# Patient Record
Sex: Male | Born: 1975 | Race: White | Hispanic: No | Marital: Married | Smoking: Never smoker
Health system: Southern US, Community
[De-identification: ages and names within clinical notes are randomized; demographics above are authoritative.]

## PROBLEM LIST (undated history)

## (undated) DIAGNOSIS — F32A Depression, unspecified: Secondary | ICD-10-CM

## (undated) DIAGNOSIS — F329 Major depressive disorder, single episode, unspecified: Secondary | ICD-10-CM

## (undated) DIAGNOSIS — I10 Essential (primary) hypertension: Secondary | ICD-10-CM

## (undated) HISTORY — PX: TONSILECTOMY, ADENOIDECTOMY, BILATERAL MYRINGOTOMY AND TUBES: SHX2538

## (undated) HISTORY — DX: Essential (primary) hypertension: I10

## (undated) HISTORY — DX: Major depressive disorder, single episode, unspecified: F32.9

## (undated) HISTORY — PX: MOLE REMOVAL: SHX2046

## (undated) HISTORY — DX: Depression, unspecified: F32.A

---

## 2015-07-25 DIAGNOSIS — H52223 Regular astigmatism, bilateral: Secondary | ICD-10-CM | POA: Diagnosis not present

## 2016-02-05 ENCOUNTER — Telehealth: Payer: Self-pay | Admitting: General Surgery

## 2016-02-05 ENCOUNTER — Ambulatory Visit (INDEPENDENT_AMBULATORY_CARE_PROVIDER_SITE_OTHER): Payer: 59 | Admitting: Family

## 2016-02-05 ENCOUNTER — Encounter: Payer: Self-pay | Admitting: Family

## 2016-02-05 VITALS — BP 142/96 | HR 95 | Temp 98.2°F | Ht 76.0 in | Wt 343.8 lb

## 2016-02-05 DIAGNOSIS — Z7689 Persons encountering health services in other specified circumstances: Secondary | ICD-10-CM

## 2016-02-05 DIAGNOSIS — R2242 Localized swelling, mass and lump, left lower limb: Secondary | ICD-10-CM

## 2016-02-05 LAB — COMPREHENSIVE METABOLIC PANEL
ALT: 33 U/L (ref 0–53)
AST: 22 U/L (ref 0–37)
Albumin: 4.5 g/dL (ref 3.5–5.2)
Alkaline Phosphatase: 72 U/L (ref 39–117)
BUN: 12 mg/dL (ref 6–23)
CHLORIDE: 101 meq/L (ref 96–112)
CO2: 31 mEq/L (ref 19–32)
Calcium: 9.4 mg/dL (ref 8.4–10.5)
Creatinine, Ser: 0.94 mg/dL (ref 0.40–1.50)
GFR: 95.26 mL/min (ref >60.00)
GLUCOSE: 110 mg/dL — AB (ref 70–99)
POTASSIUM: 4.4 meq/L (ref 3.5–5.1)
Sodium: 141 mEq/L (ref 135–145)
TOTAL PROTEIN: 7 g/dL (ref 6.0–8.3)
Total Bilirubin: 0.7 mg/dL (ref 0.2–1.2)

## 2016-02-05 LAB — LIPID PANEL
Cholesterol: 159 mg/dL (ref 0–200)
HDL: 50.4 mg/dL (ref >39.00)
LDL Cholesterol: 93 mg/dL (ref 0–99)
NONHDL: 108.58
Total CHOL/HDL Ratio: 3
Triglycerides: 77 mg/dL (ref 0.0–149.0)
VLDL: 15.4 mg/dL (ref 0.0–40.0)

## 2016-02-05 LAB — CBC WITH DIFFERENTIAL/PLATELET
Basophils Absolute: 0 10*3/uL (ref 0.0–0.1)
Basophils Relative: 0.4 % (ref 0.0–3.0)
EOS ABS: 0.1 10*3/uL (ref 0.0–0.7)
EOS PCT: 1.1 % (ref 0.0–5.0)
HCT: 46.2 % (ref 39.0–52.0)
Hemoglobin: 15.9 g/dL (ref 13.0–17.0)
LYMPHS ABS: 1.8 10*3/uL (ref 0.7–4.0)
Lymphocytes Relative: 28.6 % (ref 12.0–46.0)
MCHC: 34.4 g/dL (ref 30.0–36.0)
MCV: 88.3 fl (ref 78.0–100.0)
MONO ABS: 0.5 10*3/uL (ref 0.1–1.0)
Monocytes Relative: 8.7 % (ref 3.0–12.0)
NEUTROS PCT: 61.2 % (ref 43.0–77.0)
Neutro Abs: 3.8 10*3/uL (ref 1.4–7.7)
Platelets: 215 10*3/uL (ref 150.0–400.0)
RBC: 5.23 Mil/uL (ref 4.22–5.81)
RDW: 13.1 % (ref 11.5–15.5)
WBC: 6.3 10*3/uL (ref 4.0–10.5)

## 2016-02-05 LAB — HEMOGLOBIN A1C: Hgb A1c MFr Bld: 5.8 % (ref 4.6–6.5)

## 2016-02-05 LAB — B12 AND FOLATE PANEL
Folate: 16.2 ng/mL (ref >5.9)
Vitamin B-12: 245 pg/mL (ref 211–911)

## 2016-02-05 LAB — VITAMIN D 25 HYDROXY (VIT D DEFICIENCY, FRACTURES): VITD: 16.82 ng/mL — ABNORMAL LOW (ref 30.00–100.00)

## 2016-02-05 LAB — TSH: TSH: 2.14 u[IU]/mL (ref 0.35–4.50)

## 2016-02-05 NOTE — Patient Instructions (Signed)
Referral to general surgery  Labs today  Ultrasound of  Left thigh  If there is no improvement in your symptoms, or if there is any worsening of symptoms, or if you have any additional concerns, please return for re-evaluation; or, if we are closed, consider going to the Emergency Room for evaluation if symptoms urgent.  Pleasure meeting you

## 2016-02-05 NOTE — Telephone Encounter (Signed)
02-05-16 L/M FOR PT TO RETURN CALL & SCHEDULE AN APPT WITH DR Marjorie Smolder CALL WHEN APPT WAS REQUESTED)LT THIGH LIPOMA & HAS GROW IN SIZE .? HAD OR IS SCHEDULED FOR AN U/S OF LT THIGH(PER OFC NOTE)/MTH

## 2016-02-05 NOTE — Progress Notes (Signed)
Subjective:    Patient ID: Gregory Hicks, male    DOB: [birth year], 40 y.o.   MRN: [id number]  CC: Gregory Hicks is a 40 y.o. male who presents today to establish care.    HPI: Here to establish care. Prior care had been 2015 with [facility]. Requesting records.    H/o of left thigh lipoma. Non tender. Grown in size. No warmth, drainage.      HISTORY:  Past Medical History:  Diagnosis Date  . Depression   . Hypertension    Past Surgical History:  Procedure Laterality Date  . MOLE REMOVAL    . TONSILECTOMY, ADENOIDECTOMY, BILATERAL MYRINGOTOMY AND TUBES     Family History  Problem Relation Age of Onset  . Arthritis Mother   . Hypertension Mother   . Arthritis Father   . Hypertension Father   . Depression Maternal Aunt   . Depression Maternal Uncle   . Depression Paternal Aunt   . Depression Paternal Uncle   . Cancer Maternal Grandmother   . Mental illness Maternal Grandmother   . Depression Maternal Grandmother   . Heart disease Maternal Grandfather   . Depression Maternal Grandfather   . Cancer Paternal Grandmother   . Depression Paternal Grandmother   . Depression Paternal Grandfather     Allergies: Patient has no allergy information on record. No current outpatient prescriptions on file prior to visit.   No current facility-administered medications on file prior to visit.     Social History  Substance Use Topics  . Smoking status: Never Smoker  . Smokeless tobacco: Never Used  . Alcohol use Yes    Review of Systems  Constitutional: Negative for chills and fever.  Respiratory: Negative for cough.   Cardiovascular: Negative for chest pain and palpitations.  Gastrointestinal: Negative for nausea and vomiting.  Skin: Negative for color change and rash.      Objective:    BP (!) 142/96   Pulse 95   Temp 98.2 F (36.8 C) (Oral)   Ht 6\' 4"  (1.93 m)   Wt (!) 343 lb 12.8 oz (155.9 kg)   SpO2 96%   BMI 41.85 kg/m  BP Readings from Last 3  Encounters:  02/05/16 (!) 142/96   Wt Readings from Last 3 Encounters:  02/05/16 (!) 343 lb 12.8 oz (155.9 kg)    Physical Exam  Constitutional: He appears well-developed and well-nourished.  Cardiovascular: Regular rhythm and normal heart sounds.   Pulmonary/Chest: Effort normal and breath sounds normal. No respiratory distress. He has no wheezes. He has no rhonchi. He has no rales.  Neurological: He is alert.  Skin: Skin is warm and dry.     Psychiatric: He has a normal mood and affect. His speech is normal and behavior is normal.  Vitals reviewed.      Assessment & Plan:   Problem List Items Addressed This Visit      Other   Encounter to establish care - Primary    Reviewed past medical and social history. Screening labs today. Patient will return for physical.      Relevant Orders   CBC with Differential/Platelet (Completed)   Comprehensive metabolic panel (Completed)   Hemoglobin A1c (Completed)   HIV antibody (Completed)   Lipid panel (Completed)   VITAMIN D 25 Hydroxy (Vit-D Deficiency, Fractures) (Completed)   TSH (Completed)   B12 and Folate Panel (Completed)   Leg mass, left    Nonspecific at this time. Working diagnosis cyst versus lipoma. Pending ultrasound and  consult to general surgery as patient would like to consider removal.       Relevant Orders   Ambulatory referral to General Surgery   [place] Misc Soft Tissue       Mr. Toman does not currently have medications on file.   No orders of the defined types were placed in this encounter.   Return precautions given.   Risks, benefits, and alternatives of the medications and treatment plan prescribed today were discussed, and patient expressed understanding.   Education regarding symptom management and diagnosis given to patient on AVS.  Continue to follow with No primary care provider on file. for routine health maintenance.   [place] and I agreed with plan.   Mable Paris,  FNP

## 2016-02-05 NOTE — Progress Notes (Signed)
Pre visit review using our clinic review tool, if applicable. No additional management support is needed unless otherwise documented below in the visit note. 

## 2016-02-06 ENCOUNTER — Encounter: Payer: Self-pay | Admitting: Family

## 2016-02-06 ENCOUNTER — Other Ambulatory Visit: Payer: Self-pay | Admitting: Family

## 2016-02-06 DIAGNOSIS — R2242 Localized swelling, mass and lump, left lower limb: Secondary | ICD-10-CM | POA: Insufficient documentation

## 2016-02-06 DIAGNOSIS — Z0000 Encounter for general adult medical examination without abnormal findings: Secondary | ICD-10-CM | POA: Insufficient documentation

## 2016-02-06 DIAGNOSIS — Z1211 Encounter for screening for malignant neoplasm of colon: Secondary | ICD-10-CM | POA: Insufficient documentation

## 2016-02-06 LAB — HIV ANTIBODY (ROUTINE TESTING W REFLEX): HIV: NONREACTIVE

## 2016-02-06 NOTE — Assessment & Plan Note (Signed)
Reviewed past medical and social history. Screening labs today. Patient will return for physical.

## 2016-02-06 NOTE — Assessment & Plan Note (Signed)
Nonspecific at this time. Working diagnosis cyst versus lipoma. Pending ultrasound and consult to general surgery as patient would like to consider removal.

## 2016-02-11 ENCOUNTER — Other Ambulatory Visit: Payer: Self-pay | Admitting: Family

## 2016-02-12 ENCOUNTER — Other Ambulatory Visit: Payer: Self-pay | Admitting: Family

## 2016-02-12 DIAGNOSIS — R2242 Localized swelling, mass and lump, left lower limb: Secondary | ICD-10-CM

## 2016-02-17 ENCOUNTER — Telehealth: Payer: Self-pay | Admitting: Family

## 2016-02-17 NOTE — Telephone Encounter (Signed)
L/M FOR PT TO CALL OF & SCHEDULE AN APPT WITH DR [facility]

## 2016-02-17 NOTE — Telephone Encounter (Signed)
Pt called and stated that he never authorized to have an HIV screening done and now he is being billed for $104. Please advise, thank you!  Call pt @ [phone]

## 2016-02-18 ENCOUNTER — Ambulatory Visit
Admission: RE | Admit: 2016-02-18 | Discharge: 2016-02-18 | Disposition: A | Discharge status: Home / Self Care | Payer: 59 | Source: Ambulatory Visit | Attending: Family | Admitting: Family

## 2016-02-18 DIAGNOSIS — R2242 Localized swelling, mass and lump, left lower limb: Secondary | ICD-10-CM | POA: Insufficient documentation

## 2016-02-19 ENCOUNTER — Encounter: Payer: Self-pay | Admitting: *Deleted

## 2016-02-26 ENCOUNTER — Ambulatory Visit: Payer: Self-pay | Admitting: General Surgery

## 2016-03-02 ENCOUNTER — Encounter: Payer: Self-pay | Admitting: Family

## 2016-03-05 ENCOUNTER — Encounter: Payer: Self-pay | Admitting: General Surgery

## 2016-03-05 ENCOUNTER — Ambulatory Visit (INDEPENDENT_AMBULATORY_CARE_PROVIDER_SITE_OTHER): Payer: 59 | Admitting: General Surgery

## 2016-03-05 ENCOUNTER — Telehealth: Payer: Self-pay | Admitting: Family

## 2016-03-05 VITALS — BP 160/90 | HR 78 | Resp 14 | Ht 76.0 in | Wt 341.0 lb

## 2016-03-05 DIAGNOSIS — D1724 Benign lipomatous neoplasm of skin and subcutaneous tissue of left leg: Secondary | ICD-10-CM

## 2016-03-05 NOTE — Progress Notes (Signed)
Patient ID: Gregory Hicks, male   DOB: [birth year], 41 y.o.   MRN: [id number]  Chief Complaint  Patient presents with  . Lipoma    HPI Gregory Hicks is a 41 y.o. male here today for a evaluation of a lipoma. Patient state he noticed this area on his left thigh about 5 years ago. He states there is some pain with walking. Additionally notes a change in size. Ultrasound done on 02/18/2016 I have reviewed the history of present illness with the patient.   HPI  Past Medical History:  Diagnosis Date  . Depression   . Hypertension     Past Surgical History:  Procedure Laterality Date  . MOLE REMOVAL    . TONSILECTOMY, ADENOIDECTOMY, BILATERAL MYRINGOTOMY AND TUBES      Family History  Problem Relation Age of Onset  . Arthritis Mother   . Hypertension Mother   . Arthritis Father   . Hypertension Father   . Depression Maternal Aunt   . Depression Maternal Uncle   . Depression Paternal Aunt   . Depression Paternal Uncle   . Cancer Maternal Grandmother   . Mental illness Maternal Grandmother   . Depression Maternal Grandmother   . Heart disease Maternal Grandfather   . Depression Maternal Grandfather   . Cancer Paternal Grandmother   . Depression Paternal Grandmother   . Depression Paternal Grandfather     Social History Social History  Substance Use Topics  . Smoking status: Never Smoker  . Smokeless tobacco: Never Used  . Alcohol use Yes    No Known Allergies  No current outpatient prescriptions on file.   No current facility-administered medications for this visit.     Review of Systems Review of Systems  Constitutional: Negative.   Respiratory: Negative.   Cardiovascular: Negative.     Blood pressure (!) 160/90, pulse 78, resp. rate 14, height 6\' 4"  (1.93 m), weight (!) 341 lb (154.7 kg).  Physical Exam Physical Exam  Constitutional: He is oriented to person, place, and time. He appears well-developed and well-nourished.  Eyes: Conjunctivae are normal.  No scleral icterus.  Neck: Neck supple.  Cardiovascular: Normal rate, regular rhythm and normal heart sounds.   Pulmonary/Chest: Effort normal and breath sounds normal.  Abdominal: Soft. Bowel sounds are normal. There is no tenderness.  Lymphadenopathy:    He has no cervical adenopathy.  Neurological: He is alert and oriented to person, place, and time.  Skin: Skin is dry.       Data Reviewed Prior notes reviewed.  Assessment    Skin lipoma with an underlying 5-7 cm subcutaneous lipoma    Plan    Mass is too large for removal in the office.  Plan for outpatient excision of lipoma at [facility].  The patient is aware to call back for any questions or concerns.  Patient wishes to contact the office when he would like to arrange.      This information has been scribed by Gaspar Cola CMA.   SANKAR,SEEPLAPUTHUR G 03/09/2016, 8:54 AM

## 2016-03-05 NOTE — Telephone Encounter (Signed)
Left vm-  Wanted to speak with him and apologize about HIV test. I take full responsibility.  Please advise that our office staff is working on rectifying.   Also, happy to hear he has an appt with a Education officer, environmental.

## 2016-03-10 ENCOUNTER — Encounter: Payer: Self-pay | Admitting: General Surgery

## 2016-03-23 DIAGNOSIS — Z3141 Encounter for fertility testing: Secondary | ICD-10-CM | POA: Diagnosis not present

## 2016-07-30 DIAGNOSIS — H52223 Regular astigmatism, bilateral: Secondary | ICD-10-CM | POA: Diagnosis not present

## 2016-09-28 NOTE — Telephone Encounter (Signed)
Bill paid.

## 2017-06-30 ENCOUNTER — Ambulatory Visit (INDEPENDENT_AMBULATORY_CARE_PROVIDER_SITE_OTHER): Payer: 59 | Admitting: Family

## 2017-06-30 ENCOUNTER — Encounter: Payer: Self-pay | Admitting: Family

## 2017-06-30 VITALS — BP 138/90 | HR 89 | Temp 98.7°F | Resp 15 | Ht 75.5 in | Wt 339.5 lb

## 2017-06-30 DIAGNOSIS — R030 Elevated blood-pressure reading, without diagnosis of hypertension: Secondary | ICD-10-CM | POA: Diagnosis not present

## 2017-06-30 DIAGNOSIS — R2242 Localized swelling, mass and lump, left lower limb: Secondary | ICD-10-CM

## 2017-06-30 DIAGNOSIS — Z0001 Encounter for general adult medical examination with abnormal findings: Secondary | ICD-10-CM

## 2017-06-30 DIAGNOSIS — Z0000 Encounter for general adult medical examination without abnormal findings: Secondary | ICD-10-CM

## 2017-06-30 LAB — LIPID PANEL
Cholesterol: 157 mg/dL (ref 0–200)
HDL: 42.9 mg/dL (ref >39.00)
LDL Cholesterol: 92 mg/dL (ref 0–99)
NonHDL: 113.97
Total CHOL/HDL Ratio: 4
Triglycerides: 110 mg/dL (ref 0.0–149.0)
VLDL: 22 mg/dL (ref 0.0–40.0)

## 2017-06-30 LAB — COMPREHENSIVE METABOLIC PANEL
ALBUMIN: 4.4 g/dL (ref 3.5–5.2)
ALK PHOS: 62 U/L (ref 39–117)
ALT: 48 U/L (ref 0–53)
AST: 31 U/L (ref 0–37)
BUN: 12 mg/dL (ref 6–23)
CO2: 30 mEq/L (ref 19–32)
Calcium: 9.5 mg/dL (ref 8.4–10.5)
Chloride: 102 mEq/L (ref 96–112)
Creatinine, Ser: 1.04 mg/dL (ref 0.40–1.50)
GFR: 84.16 mL/min (ref >60.00)
Glucose, Bld: 94 mg/dL (ref 70–99)
POTASSIUM: 4 meq/L (ref 3.5–5.1)
SODIUM: 139 meq/L (ref 135–145)
TOTAL PROTEIN: 7.5 g/dL (ref 6.0–8.3)
Total Bilirubin: 1 mg/dL (ref 0.2–1.2)

## 2017-06-30 LAB — CBC WITH DIFFERENTIAL/PLATELET
Basophils Absolute: 0 10*3/uL (ref 0.0–0.1)
Basophils Relative: 0.9 % (ref 0.0–3.0)
EOS ABS: 0.1 10*3/uL (ref 0.0–0.7)
EOS PCT: 1.5 % (ref 0.0–5.0)
HCT: 48 % (ref 39.0–52.0)
HEMOGLOBIN: 15.8 g/dL (ref 13.0–17.0)
Lymphocytes Relative: 36.6 % (ref 12.0–46.0)
Lymphs Abs: 1.7 10*3/uL (ref 0.7–4.0)
MCHC: 32.9 g/dL (ref 30.0–36.0)
MCV: 91.9 fl (ref 78.0–100.0)
MONO ABS: 0.5 10*3/uL (ref 0.1–1.0)
Monocytes Relative: 11 % (ref 3.0–12.0)
Neutro Abs: 2.4 10*3/uL (ref 1.4–7.7)
Neutrophils Relative %: 50 % (ref 43.0–77.0)
Platelets: 201 10*3/uL (ref 150.0–400.0)
RBC: 5.22 Mil/uL (ref 4.22–5.81)
RDW: 13.7 % (ref 11.5–15.5)
WBC: 4.7 10*3/uL (ref 4.0–10.5)

## 2017-06-30 LAB — VITAMIN D 25 HYDROXY (VIT D DEFICIENCY, FRACTURES): VITD: 20.79 ng/mL — AB (ref 30.00–100.00)

## 2017-06-30 LAB — HEMOGLOBIN A1C: Hgb A1c MFr Bld: 5.7 % (ref 4.6–6.5)

## 2017-06-30 NOTE — Assessment & Plan Note (Signed)
Declines second surgery consult.  Patient will monitor for now.

## 2017-06-30 NOTE — Assessment & Plan Note (Signed)
Testicular exam performed today.  Screening labs ordered.  Referral to dermatology.  Encourage exercise

## 2017-06-30 NOTE — Assessment & Plan Note (Signed)
Slightly elevated today.  Patient will monitor at home let me know if persistent.

## 2017-06-30 NOTE — Progress Notes (Signed)
Subjective:    Patient ID: Gregory Hicks, male    DOB: [birth year], 42 y.o.   MRN: [id number]  CC: Gregory Hicks is a 42 y.o. male who presents today for physical exam.    HPI: Feeling well. No complaints.   H/o htn- higher at doctors office.   127/84. Denies exertional chest pain or pressure, numbness or tingling radiating to left arm or jaw, palpitations, dizziness, frequent headaches, changes in vision, or shortness of breath.    Left lipoma- not planning surgery.   Colorectal  Cancer Screening: no early family history Prostate Cancer Screening: Has been discussed with patient;  Will start screening in 40's.  Lung Cancer Screening: No 30 year pack year history and > 55 years.  Immunizations       Tetanus - utd       Labs: Screening labs today. Exercise: Gets regular exercise.  Alcohol use:  rare Smoking/tobacco use: Nonsmoker.  Regular dental exams: UTD Wears seat belt: Yes. Skin: h/o mole removed, precancerous.   HISTORY:  Past Medical History:  Diagnosis Date  . Depression   . Hypertension     Past Surgical History:  Procedure Laterality Date  . MOLE REMOVAL    . TONSILECTOMY, ADENOIDECTOMY, BILATERAL MYRINGOTOMY AND TUBES     Family History  Problem Relation Age of Onset  . Arthritis Mother   . Hypertension Mother   . Arthritis Father   . Hypertension Father   . Depression Maternal Aunt   . Depression Maternal Uncle   . Depression Paternal Aunt   . Depression Paternal Uncle   . Cancer Maternal Grandmother        breast  . Mental illness Maternal Grandmother   . Depression Maternal Grandmother   . Heart disease Maternal Grandfather   . Depression Maternal Grandfather   . Cancer Paternal Grandmother        breast  . Depression Paternal Grandmother   . Depression Paternal Grandfather   . Colon cancer Neg Hx   . Prostate cancer Neg Hx       ALLERGIES: Penicillins  Current Outpatient Medications on File Prior to Visit  Medication Sig Dispense  Refill  . cetirizine (ZYRTEC ALLERGY) 10 MG tablet      No current facility-administered medications on file prior to visit.     Social History   Tobacco Use  . Smoking status: Never Smoker  . Smokeless tobacco: Never Used  Substance Use Topics  . Alcohol use: Yes    Comment: beer once a month  . Drug use: No    Review of Systems  Constitutional: Negative for chills and fever.  HENT: Negative for congestion.   Respiratory: Negative for cough.   Cardiovascular: Negative for chest pain, palpitations and leg swelling.  Gastrointestinal: Negative for diarrhea, nausea and vomiting.  Genitourinary: Negative for difficulty urinating, penile swelling, scrotal swelling and testicular pain.  Musculoskeletal: Negative for myalgias.  Skin: Negative for rash.  Neurological: Negative for headaches.  Hematological: Negative for adenopathy.  Psychiatric/Behavioral: Negative for confusion.      Objective:    BP 138/90 (BP Location: Left Arm, Patient Position: Sitting, Cuff Size: Large)   Pulse 89   Temp 98.7 F (37.1 C) (Oral)   Resp 15   Ht 6' 3.5" (1.918 m)   Wt (!) 339 lb 8 oz (154 kg)   SpO2 98%   BMI 41.87 kg/m   BP Readings from Last 3 Encounters:  06/30/17 138/90  03/05/16 (!) 160/90  02/05/16 Marland Kitchen)  142/96   Wt Readings from Last 3 Encounters:  06/30/17 (!) 339 lb 8 oz (154 kg)  03/05/16 (!) 341 lb (154.7 kg)  02/05/16 (!) 343 lb 12.8 oz (155.9 kg)    Physical Exam  Constitutional: He appears well-developed and well-nourished.  Neck: No thyroid mass and no thyromegaly present.  Cardiovascular: Regular rhythm and normal heart sounds.  Pulmonary/Chest: Effort normal and breath sounds normal. No respiratory distress. He has no wheezes. He has no rhonchi. He has no rales.  Genitourinary: Testes normal and penis normal. Cremasteric reflex is present. Right testis shows no mass, no swelling and no tenderness. Left testis shows no mass, no swelling and no tenderness.  Circumcised.  Genitourinary Comments: Testicular exam performed. No masses or asymmetry appreciated.   Lymphadenopathy:       Head (right side): No submental, no submandibular, no tonsillar, no preauricular, no posterior auricular and no occipital adenopathy present.       Head (left side): No submental, no submandibular, no tonsillar, no preauricular, no posterior auricular and no occipital adenopathy present.    He has no cervical adenopathy.    He has no axillary adenopathy.  Neurological: He is alert.  Skin: Skin is warm and dry.  Psychiatric: He has a normal mood and affect. His speech is normal and behavior is normal.  Vitals reviewed.      Assessment & Plan:   Problem List Items Addressed This Visit      Other   Routine physical examination - Primary    Testicular exam performed today.  Screening labs ordered.  Referral to dermatology.  Encourage exercise      Relevant Orders   Ambulatory referral to Dermatology   Hemoglobin A1c   VITAMIN D 25 Hydroxy (Vit-D Deficiency, Fractures)   Lipid panel   Comprehensive metabolic panel   CBC with Differential/Platelet   Leg mass, left    Declines second surgery consult.  Patient will monitor for now.      Elevated blood pressure reading    Slightly elevated today.  Patient will monitor at home let me know if persistent.          I am having Gregory Hicks maintain his cetirizine.   No orders of the defined types were placed in this encounter.   Return precautions given.   Risks, benefits, and alternatives of the medications and treatment plan prescribed today were discussed, and patient expressed understanding.   Education regarding symptom management and diagnosis given to patient on AVS.   Continue to follow with Gregory Hawthorne, FNP for routine health maintenance.   Gregory Hicks and I agreed with plan.   Gregory Paris, FNP

## 2017-06-30 NOTE — Patient Instructions (Addendum)
Monitor blood pressure,  Goal is less than 130/80; if persistently higher, please make sooner follow up appointment so we can recheck you blood pressure and manage medications.     Labs today   Health Maintenance, Male A healthy lifestyle and preventive care is important for your health and wellness. Ask your health care provider about what schedule of regular examinations is right for you. What should I know about weight and diet? Eat a Healthy Diet  Eat plenty of vegetables, fruits, whole grains, low-fat dairy products, and lean protein.  Do not eat a lot of foods high in solid fats, added sugars, or salt.  Maintain a Healthy Weight Regular exercise can help you achieve or maintain a healthy weight. You should:  Do at least 150 minutes of exercise each week. The exercise should increase your heart rate and make you sweat (moderate-intensity exercise).  Do strength-training exercises at least twice a week.  Watch Your Levels of Cholesterol and Blood Lipids  Have your blood tested for lipids and cholesterol every 5 years starting at 40 years of age. If you are at high risk for heart disease, you should start having your blood tested when you are 40 years old. You may need to have your cholesterol levels checked more often if: ? Your lipid or cholesterol levels are high. ? You are older than 40 years of age. ? You are at high risk for heart disease.  What should I know about cancer screening? Many types of cancers can be detected early and may often be prevented. Lung Cancer  You should be screened every year for lung cancer if: ? You are a current smoker who has smoked for at least 30 years. ? You are a former smoker who has quit within the past 15 years.  Talk to your health care provider about your screening options, when you should start screening, and how often you should be screened.  Colorectal Cancer  Routine colorectal cancer screening usually begins at 40 years of  age and should be repeated every 5-10 years until you are 40 years old. You may need to be screened more often if early forms of precancerous polyps or small growths are found. Your health care provider may recommend screening at an earlier age if you have risk factors for colon cancer.  Your health care provider may recommend using home test kits to check for hidden blood in the stool.  A small camera at the end of a tube can be used to examine your colon (sigmoidoscopy or colonoscopy). This checks for the earliest forms of colorectal cancer.  Prostate and Testicular Cancer  Depending on your age and overall health, your health care provider may do certain tests to screen for prostate and testicular cancer.  Talk to your health care provider about any symptoms or concerns you have about testicular or prostate cancer.  Skin Cancer  Check your skin from head to toe regularly.  Tell your health care provider about any new moles or changes in moles, especially if: ? There is a change in a mole's size, shape, or color. ? You have a mole that is larger than a pencil eraser.  Always use sunscreen. Apply sunscreen liberally and repeat throughout the day.  Protect yourself by wearing long sleeves, pants, a wide-brimmed hat, and sunglasses when outside.  What should I know about heart disease, diabetes, and high blood pressure?  If you are 53-45 years of age, have your blood pressure checked every 3-5  years. If you are 80 years of age or older, have your blood pressure checked every year. You should have your blood pressure measured twice-once when you are at a hospital or clinic, and once when you are not at a hospital or clinic. Record the average of the two measurements. To check your blood pressure when you are not at a hospital or clinic, you can use: ? An automated blood pressure machine at a pharmacy. ? A home blood pressure monitor.  Talk to your health care provider about your target  blood pressure.  If you are between 9-61 years old, ask your health care provider if you should take aspirin to prevent heart disease.  Have regular diabetes screenings by checking your fasting blood sugar level. ? If you are at a normal weight and have a low risk for diabetes, have this test once every three years after the age of 88. ? If you are overweight and have a high risk for diabetes, consider being tested at a younger age or more often.  A one-time screening for abdominal aortic aneurysm (AAA) by ultrasound is recommended for men aged 59-75 years who are current or former smokers. What should I know about preventing infection? Hepatitis B If you have a higher risk for hepatitis B, you should be screened for this virus. Talk with your health care provider to find out if you are at risk for hepatitis B infection. Hepatitis C Blood testing is recommended for:  Everyone born from [birth year] through 1963.  Anyone with known risk factors for hepatitis C.  Sexually Transmitted Diseases (STDs)  You should be screened each year for STDs including gonorrhea and chlamydia if: ? You are sexually active and are younger than 40 years of age. ? You are older than 40 years of age and your health care provider tells you that you are at risk for this type of infection. ? Your sexual activity has changed since you were last screened and you are at an increased risk for chlamydia or gonorrhea. Ask your health care provider if you are at risk.  Talk with your health care provider about whether you are at high risk of being infected with HIV. Your health care provider may recommend a prescription medicine to help prevent HIV infection.  What else can I do?  Schedule regular health, dental, and eye exams.  Stay current with your vaccines (immunizations).  Do not use any tobacco products, such as cigarettes, chewing tobacco, and e-cigarettes. If you need help quitting, ask your health care  provider.  Limit alcohol intake to no more than 2 drinks per day. One drink equals 12 ounces of beer, 5 ounces of wine, or 1 ounces of hard liquor.  Do not use street drugs.  Do not share needles.  Ask your health care provider for help if you need support or information about quitting drugs.  Tell your health care provider if you often feel depressed.  Tell your health care provider if you have ever been abused or do not feel safe at home. This information is not intended to replace advice given to you by your health care provider. Make sure you discuss any questions you have with your health care provider. Document Released: 07/25/2007 Document Revised: 09/25/2015 Document Reviewed: 10/30/2014 Elsevier Interactive Patient Education  [employer].

## 2018-06-10 DIAGNOSIS — R222 Localized swelling, mass and lump, trunk: Secondary | ICD-10-CM | POA: Insufficient documentation

## 2019-06-09 ENCOUNTER — Encounter: Payer: Self-pay | Admitting: Family

## 2019-06-16 ENCOUNTER — Encounter: Payer: Self-pay | Admitting: Family

## 2019-06-16 ENCOUNTER — Ambulatory Visit (INDEPENDENT_AMBULATORY_CARE_PROVIDER_SITE_OTHER): Payer: 59 | Admitting: Family

## 2019-06-16 ENCOUNTER — Other Ambulatory Visit: Payer: Self-pay

## 2019-06-16 VITALS — BP 144/80 | HR 106 | Temp 97.7°F | Ht 74.5 in | Wt 279.8 lb

## 2019-06-16 DIAGNOSIS — R2242 Localized swelling, mass and lump, left lower limb: Secondary | ICD-10-CM

## 2019-06-16 DIAGNOSIS — Z0000 Encounter for general adult medical examination without abnormal findings: Secondary | ICD-10-CM | POA: Diagnosis not present

## 2019-06-16 DIAGNOSIS — D171 Benign lipomatous neoplasm of skin and subcutaneous tissue of trunk: Secondary | ICD-10-CM

## 2019-06-16 DIAGNOSIS — R030 Elevated blood-pressure reading, without diagnosis of hypertension: Secondary | ICD-10-CM

## 2019-06-16 NOTE — Patient Instructions (Addendum)
Call [facility] since your wife goes there; let me know if you need a referral.   Referral to surgery,  Let us know if you dont hear back within a week in regards to an appointment being scheduled.   Continue to monitor blood pressure as we discussed today.  If Blood pressure does not reach normal 120/80 as you continue your weight loss journey, please let me know.   Managing Your Hypertension Hypertension is commonly called high blood pressure. This is when the force of your blood pressing against the walls of your arteries is too strong. Arteries are blood vessels that carry blood from your heart throughout your body. Hypertension forces the heart to work harder to pump blood, and may cause the arteries to become narrow or stiff. Having untreated or uncontrolled hypertension can cause heart attack, stroke, kidney disease, and other problems. What are blood pressure readings? A blood pressure reading consists of a higher number over a lower number. Ideally, your blood pressure should be below 120/80. The first ("top") number is called the systolic pressure. It is a measure of the pressure in your arteries as your heart beats. The second ("bottom") number is called the diastolic pressure. It is a measure of the pressure in your arteries as the heart relaxes. What does my blood pressure reading mean? Blood pressure is classified into four stages. Based on your blood pressure reading, your health care provider may use the following stages to determine what type of treatment you need, if any. Systolic pressure and diastolic pressure are measured in a unit called mm Hg. Normal  Systolic pressure: below 123456.  Diastolic pressure: below 80. Elevated  Systolic pressure: [id number].  Diastolic pressure: below 80. Hypertension stage 1  Systolic pressure: 0000000.  Diastolic pressure: [id number]. Hypertension stage 2  Systolic pressure: [id number] or above.  Diastolic pressure: 90 or above. What  health risks are associated with hypertension? Managing your hypertension is an important responsibility. Uncontrolled hypertension can lead to:  A heart attack.  A stroke.  A weakened blood vessel (aneurysm).  Heart failure.  Kidney damage.  Eye damage.  Metabolic syndrome.  Memory and concentration problems. What changes can I make to manage my hypertension? Hypertension can be managed by making lifestyle changes and possibly by taking medicines. Your health care provider will help you make a plan to bring your blood pressure within a normal range. Eating and drinking   Eat a diet that is high in fiber and potassium, and low in salt (sodium), added sugar, and fat. An example eating plan is called the DASH (Dietary Approaches to Stop Hypertension) diet. To eat this way: ? Eat plenty of fresh fruits and vegetables. Try to fill half of your plate at each meal with fruits and vegetables. ? Eat whole grains, such as whole wheat pasta, brown rice, or whole grain bread. Fill about one quarter of your plate with whole grains. ? Eat low-fat diary products. ? Avoid fatty cuts of meat, processed or cured meats, and poultry with skin. Fill about one quarter of your plate with lean proteins such as fish, chicken without skin, beans, eggs, and tofu. ? Avoid premade and processed foods. These tend to be higher in sodium, added sugar, and fat.  Reduce your daily sodium intake. Most people with hypertension should eat less than 1,500 mg of sodium a day.  Limit alcohol intake to no more than 1 drink a day for nonpregnant women and 2 drinks a day for men. One drink  equals 12 oz of beer, 5 oz of wine, or 1 oz of hard liquor. Lifestyle  Work with your health care provider to maintain a healthy body weight, or to lose weight. Ask what an ideal weight is for you.  Get at least 30 minutes of exercise that causes your heart to beat faster (aerobic exercise) most days of the week. Activities may  include walking, swimming, or biking.  Include exercise to strengthen your muscles (resistance exercise), such as weight lifting, as part of your weekly exercise routine. Try to do these types of exercises for 30 minutes at least 3 days a week.  Do not use any products that contain nicotine or tobacco, such as cigarettes and e-cigarettes. If you need help quitting, ask your health care provider.  Control any long-term (chronic) conditions you have, such as high cholesterol or diabetes. Monitoring  Monitor your blood pressure at home as told by your health care provider. Your personal target blood pressure may vary depending on your medical conditions, your age, and other factors.  Have your blood pressure checked regularly, as often as told by your health care provider. Working with your health care provider  Review all the medicines you take with your health care provider because there may be side effects or interactions.  Talk with your health care provider about your diet, exercise habits, and other lifestyle factors that may be contributing to hypertension.  Visit your health care provider regularly. Your health care provider can help you create and adjust your plan for managing hypertension. Will I need medicine to control my blood pressure? Your health care provider may prescribe medicine if lifestyle changes are not enough to get your blood pressure under control, and if:  Your systolic blood pressure is 130 or higher.  Your diastolic blood pressure is 80 or higher. Take medicines only as told by your health care provider. Follow the directions carefully. Blood pressure medicines must be taken as prescribed. The medicine does not work as well when you skip doses. Skipping doses also puts you at risk for problems. Contact a health care provider if:  You think you are having a reaction to medicines you have taken.  You have repeated (recurrent) headaches.  You feel dizzy.  You  have swelling in your ankles.  You have trouble with your vision. Get help right away if:  You develop a severe headache or confusion.  You have unusual weakness or numbness, or you feel faint.  You have severe pain in your chest or abdomen.  You vomit repeatedly.  You have trouble breathing. Summary  Hypertension is when the force of blood pumping through your arteries is too strong. If this condition is not controlled, it may put you at risk for serious complications.  Your personal target blood pressure may vary depending on your medical conditions, your age, and other factors. For most people, a normal blood pressure is less than 120/80.  Hypertension is managed by lifestyle changes, medicines, or both. Lifestyle changes include weight loss, eating a healthy, low-sodium diet, exercising more, and limiting alcohol. This information is not intended to replace advice given to you by your health care provider. Make sure you discuss any questions you have with your health care provider. Document Revised: 05/20/2018 Document Reviewed: 12/25/2015 Elsevier Patient Education  [place].

## 2019-06-16 NOTE — Progress Notes (Signed)
Subjective:    Patient ID: Gregory Hicks, male    DOB: 03/21/1975, 44 y.o.   MRN: [id number]  CC: Raymont Lubrano is a 44 y.o. male who presents today for physical exam.    HPI: Has noticed 'lump' on right upper back, first noticed July 2020.  Somewhat smaller than had been in last year.  Nontender, no purulent discharge.    Has left leg lipoma- would like to again try to get removed; unable to due to covid.   Elevated blood pressure- at home, BP is 125/86, HR 72, 128/83, 85. Arm cuff at home.  Denies exertional chest pain or pressure, numbness or tingling radiating to left arm or jaw, palpitations, dizziness, frequent headaches, changes in vision, or shortness of breath.    NO NSAIDs.  Doesn't snore.  NO fatigue, HA.     Colorectal  Cancer Screening: No early colon cancer in the family Prostate Cancer Screening: Deferred until 44 years of age        Tetanus - due    Labs: Screening labs today. Exercise: Gets regular exercise, 6 days per week. Has lost weight intentionally with exercise.  Alcohol use: occassional Smoking/tobacco use: Nonsmoker.  Skin:h/o precancerous lesions.   HISTORY:  Past Medical History:  Diagnosis Date  . Depression   . Hypertension     Past Surgical History:  Procedure Laterality Date  . MOLE REMOVAL    . TONSILECTOMY, ADENOIDECTOMY, BILATERAL MYRINGOTOMY AND TUBES     Family History  Problem Relation Age of Onset  . Arthritis Mother   . Hypertension Mother   . Arthritis Father   . Hypertension Father   . Depression Maternal Aunt   . Depression Maternal Uncle   . Depression Paternal Aunt   . Depression Paternal Uncle   . Cancer Maternal Grandmother        breast  . Mental illness Maternal Grandmother   . Depression Maternal Grandmother   . Diabetes Maternal Grandmother   . Heart disease Maternal Grandfather   . Depression Maternal Grandfather   . Diabetes Maternal Grandfather   . Cancer Paternal Grandmother        breast  .  Depression Paternal Grandmother   . Depression Paternal Grandfather   . Colon cancer Neg Hx   . Prostate cancer Neg Hx       ALLERGIES: Penicillins  Current Outpatient Medications on File Prior to Visit  Medication Sig Dispense Refill  . cetirizine (ZYRTEC ALLERGY) 10 MG tablet      No current facility-administered medications on file prior to visit.    Social History   Tobacco Use  . Smoking status: Never Smoker  . Smokeless tobacco: Never Used  Substance Use Topics  . Alcohol use: Yes    Comment: beer once a month  . Drug use: No    Review of Systems  Constitutional: Negative for chills, fever and unexpected weight change.  HENT: Negative for congestion.   Respiratory: Negative for cough.   Cardiovascular: Negative for chest pain, palpitations and leg swelling.  Gastrointestinal: Negative for diarrhea, nausea and vomiting.  Musculoskeletal: Negative for myalgias.  Skin: Negative for pallor, rash and wound.  Neurological: Negative for headaches.  Hematological: Negative for adenopathy.  Psychiatric/Behavioral: Negative for confusion.      Objective:    BP (!) 144/80 (BP Location: Left Arm, Patient Position: Sitting, Cuff Size: Large)   Pulse (!) 106   Temp 97.7 F (36.5 C)   Ht 6' 2.5" (1.892 m)  Wt 279 lb 12.8 oz (126.9 kg)   SpO2 98%   BMI 35.44 kg/m   BP Readings from Last 3 Encounters:  06/16/19 (!) 144/80  06/30/17 138/90  03/05/16 (!) 160/90   Wt Readings from Last 3 Encounters:  06/16/19 279 lb 12.8 oz (126.9 kg)  06/30/17 (!) 339 lb 8 oz (154 kg)  03/05/16 (!) 341 lb (154.7 kg)    Physical Exam Vitals reviewed.  Constitutional:      Appearance: He is well-developed.  Neck:     Thyroid: No thyroid mass or thyromegaly.  Cardiovascular:     Rate and Rhythm: Regular rhythm.     Heart sounds: Normal heart sounds.  Pulmonary:     Effort: Pulmonary effort is normal. No respiratory distress.     Breath sounds: Normal breath sounds. No  wheezing, rhonchi or rales.  Musculoskeletal:       Arms:     Comments: 1-2 cm mass noted on diagram.  Nonfluctuant nontender.  Lymphadenopathy:     Head:     Right side of head: No submental, submandibular, tonsillar, preauricular, posterior auricular or occipital adenopathy.     Left side of head: No submental, submandibular, tonsillar, preauricular, posterior auricular or occipital adenopathy.     Cervical: No cervical adenopathy.  Skin:    General: Skin is warm and dry.  Neurological:     Mental Status: He is alert.  Psychiatric:        Speech: Speech normal.        Behavior: Behavior normal.        Assessment & Plan:   Problem List Items Addressed This Visit      Other   Elevated blood pressure reading    Blood pressure at homes more acceptable however not to goal < 120/80. We deferred starting amlodipine 2.5mg  as patient is actively working on loosing weight. He will closely monitor. We will follow up in 6 months and discuss again.        Leg mass, left   Relevant Orders   Ambulatory referral to General Surgery   Lipoma of back    Suspected lipoma on left thoracic back.  Referral has been placed to general surgery for evaluation of lipoma on his left leg and left upper back.       Routine physical examination - Primary    Congratulated patient on weight loss. Encouraged continued exercise. Advised to schedule dermatology appointment.      Relevant Orders   TSH   CBC with Differential/Platelet   Comprehensive metabolic panel   Hemoglobin A1c   Lipid panel   VITAMIN D 25 Hydroxy (Vit-D Deficiency, Fractures)       I am having Peterson Ao Wegener maintain his cetirizine.   No orders of the defined types were placed in this encounter.   Return precautions given.   Risks, benefits, and alternatives of the medications and treatment plan prescribed today were discussed, and patient expressed understanding.   Education regarding symptom management and diagnosis  given to patient on AVS.   Continue to follow with Burnard Hawthorne, FNP for routine health maintenance.   [place] and I agreed with plan.   Mable Paris, FNP

## 2019-06-16 NOTE — Assessment & Plan Note (Signed)
Suspected lipoma on left thoracic back.  Referral has been placed to general surgery for evaluation of lipoma on his left leg and left upper back.

## 2019-06-16 NOTE — Assessment & Plan Note (Signed)
Blood pressure at homes more acceptable however not to goal < 120/80. We deferred starting amlodipine 2.5mg  as patient is actively working on loosing weight. He will closely monitor. We will follow up in 6 months and discuss again.

## 2019-06-16 NOTE — Assessment & Plan Note (Addendum)
Congratulated patient on weight loss. Encouraged continued exercise. Advised to schedule dermatology appointment.

## 2019-06-19 NOTE — Progress Notes (Signed)
LMTCB

## 2019-06-21 ENCOUNTER — Other Ambulatory Visit: Payer: Self-pay

## 2019-06-21 ENCOUNTER — Other Ambulatory Visit (INDEPENDENT_AMBULATORY_CARE_PROVIDER_SITE_OTHER): Payer: 59

## 2019-06-21 DIAGNOSIS — Z0000 Encounter for general adult medical examination without abnormal findings: Secondary | ICD-10-CM | POA: Diagnosis not present

## 2019-06-22 ENCOUNTER — Encounter: Payer: Self-pay | Admitting: Family

## 2019-06-22 LAB — CBC WITH DIFFERENTIAL/PLATELET
Absolute Monocytes: 497 cells/uL (ref 200–950)
Basophils Absolute: 41 cells/uL (ref 0–200)
Basophils Relative: 0.9 %
Eosinophils Absolute: 41 cells/uL (ref 15–500)
Eosinophils Relative: 0.9 %
HCT: 50.4 % — ABNORMAL HIGH (ref 38.5–50.0)
Hemoglobin: 16.8 g/dL (ref 13.2–17.1)
Lymphs Abs: 1753 cells/uL (ref 850–3900)
MCH: 30.2 pg (ref 27.0–33.0)
MCHC: 33.3 g/dL (ref 32.0–36.0)
MCV: 90.6 fL (ref 80.0–100.0)
MPV: 11.5 fL (ref 7.5–12.5)
Monocytes Relative: 10.8 %
Neutro Abs: 2268 cells/uL (ref 1500–7800)
Neutrophils Relative %: 49.3 %
Platelets: 223 10*3/uL (ref 140–400)
RBC: 5.56 10*6/uL (ref 4.20–5.80)
RDW: 12.6 % (ref 11.0–15.0)
Total Lymphocyte: 38.1 %
WBC: 4.6 10*3/uL (ref 3.8–10.8)

## 2019-06-22 LAB — COMPREHENSIVE METABOLIC PANEL
AG Ratio: 1.8 (calc) (ref 1.0–2.5)
ALT: 19 U/L (ref 9–46)
AST: 20 U/L (ref 10–40)
Albumin: 4.8 g/dL (ref 3.6–5.1)
Alkaline phosphatase (APISO): 62 U/L (ref 36–130)
BUN: 12 mg/dL (ref 7–25)
CO2: 27 mmol/L (ref 20–32)
Calcium: 10 mg/dL (ref 8.6–10.3)
Chloride: 102 mmol/L (ref 98–110)
Creat: 1.05 mg/dL (ref 0.60–1.35)
Globulin: 2.6 g/dL (calc) (ref 1.9–3.7)
Glucose, Bld: 90 mg/dL (ref 65–99)
Potassium: 4.3 mmol/L (ref 3.5–5.3)
Sodium: 140 mmol/L (ref 135–146)
Total Bilirubin: 1.1 mg/dL (ref 0.2–1.2)
Total Protein: 7.4 g/dL (ref 6.1–8.1)

## 2019-06-22 LAB — HEMOGLOBIN A1C
Hgb A1c MFr Bld: 4.8 % of total Hgb (ref <5.7)
Mean Plasma Glucose: 91 (calc)
eAG (mmol/L): 5 (calc)

## 2019-06-22 LAB — TSH: TSH: 2.16 mIU/L (ref 0.40–4.50)

## 2019-06-22 LAB — LIPID PANEL
Cholesterol: 142 mg/dL (ref <200)
HDL: 51 mg/dL (ref >=40)
LDL Cholesterol (Calc): 75 mg/dL (calc)
Non-HDL Cholesterol (Calc): 91 mg/dL (calc) (ref <130)
Total CHOL/HDL Ratio: 2.8 (calc) (ref <5.0)
Triglycerides: 82 mg/dL (ref <150)

## 2019-06-22 LAB — VITAMIN D 25 HYDROXY (VIT D DEFICIENCY, FRACTURES): Vit D, 25-Hydroxy: 25 ng/mL — ABNORMAL LOW (ref 30–100)

## 2019-06-26 ENCOUNTER — Other Ambulatory Visit: Payer: Self-pay | Admitting: Family

## 2019-06-26 DIAGNOSIS — E559 Vitamin D deficiency, unspecified: Secondary | ICD-10-CM

## 2019-06-26 MED ORDER — CHOLECALCIFEROL 1.25 MG (50000 UT) PO TABS: ORAL_TABLET | ORAL | 0 refills | Status: DC

## 2019-07-11 ENCOUNTER — Telehealth: Payer: Self-pay | Admitting: Family

## 2019-07-11 NOTE — Telephone Encounter (Signed)
Left pt a vm to call ofc regarding referral to general surgeon.

## 2019-07-12 NOTE — Telephone Encounter (Signed)
Pt called back returning your call °

## 2019-07-13 NOTE — Telephone Encounter (Signed)
Pt called again about referral

## 2019-07-14 NOTE — Telephone Encounter (Signed)
Thanks

## 2019-07-17 ENCOUNTER — Other Ambulatory Visit: Payer: Self-pay

## 2019-07-17 ENCOUNTER — Encounter: Payer: Self-pay | Admitting: Surgery

## 2019-07-17 ENCOUNTER — Ambulatory Visit (INDEPENDENT_AMBULATORY_CARE_PROVIDER_SITE_OTHER): Payer: 59 | Admitting: Surgery

## 2019-07-17 DIAGNOSIS — D179 Benign lipomatous neoplasm, unspecified: Secondary | ICD-10-CM

## 2019-07-17 NOTE — Patient Instructions (Addendum)
Your CT Scan is scheduled for 07/24/19 at the [facility] at 2:30pm. Arrival 2:15pm. Liquids only 4 hours prior.  We have scheduled an appointment to get your lipoma removed here at the office. Please see your appointment below. Call the office if you have any questions or concerns.   Lipoma Removal  Lipoma removal is a surgical procedure to remove a lipoma, which is a noncancerous (benign) tumor that is made up of fat cells. Most lipomas are small and painless and do not require treatment. They can form in many areas of the body but are most common under the skin of the back, arms, shoulders, buttocks, and thighs. You may need lipoma removal if you have a lipoma that is large, growing, or causing discomfort. Lipoma removal may also be done for cosmetic reasons. Tell a health care provider about:  Any allergies you have.  All medicines you are taking, including vitamins, herbs, eye drops, creams, and over-the-counter medicines.  Any problems you or family members have had with anesthetic medicines.  Any blood disorders you have.  Any surgeries you have had.  Any medical conditions you have.  Whether you are pregnant or may be pregnant. What are the risks? Generally, this is a safe procedure. However, problems may occur, including:  Infection.  Bleeding.  Scarring.  Allergic reactions to medicines.  Damage to nearby structures or organs, such as damage to nerves or blood vessels near the lipoma. What happens before the procedure? Staying hydrated Follow instructions from your health care provider about hydration, which may include:  Up to 2 hours before the procedure - you may continue to drink clear liquids, such as water, clear fruit juice, black coffee, and plain tea. Eating and drinking restrictions Follow instructions from your health care provider about eating and drinking, which may include:  8 hours before the procedure - stop eating heavy meals or  foods, such as meat, fried foods, or fatty foods.  6 hours before the procedure - stop eating light meals or foods, such as toast or cereal.  6 hours before the procedure - stop drinking milk or drinks that contain milk.  2 hours before the procedure - stop drinking clear liquids. Medicines Ask your health care provider about:  Changing or stopping your regular medicines. This is especially important if you are taking diabetes medicines or blood thinners.  Taking medicines such as aspirin and ibuprofen. These medicines can thin your blood. Do not take these medicines unless your health care provider tells you to take them.  Taking over-the-counter medicines, vitamins, herbs, and supplements. General instructions  You will have a physical exam. Your health care provider will check the size of the lipoma and whether it can be moved easily.  You may have a biopsy and imaging tests, such as X-rays, a CT scan, and an MRI.  Do not use any products that contain nicotine or tobacco for at least 4 weeks before the procedure. These products include cigarettes, e-cigarettes, and chewing tobacco. If you need help quitting, ask your health care provider.  Ask your health care provider: ? How your surgery site will be marked. ? What steps will be taken to help prevent infection. These may include:  Washing skin with a germ-killing soap.  Taking antibiotic medicine.  Plan to have someone take you home from the hospital or clinic.  If you will be going home right after the procedure, plan to have someone with you for 24 hours. What happens during the  procedure?   An IV will be inserted into one of your veins.  You will be given one or more of the following: ? A medicine to help you relax (sedative). ? A medicine to numb the area (local anesthetic). ? A medicine to make you fall asleep (general anesthetic). ? A medicine that is injected into an area of your body to numb everything below the  injection site (regional anesthetic).  An incision will be made over the lipoma or very near the lipoma. The incision may be made in a natural skin line or crease.  Tissues, nerves, and blood vessels near the lipoma will be moved out of the way.  The lipoma and the capsule that surrounds it will be separated from the surrounding tissues.  The lipoma will be removed.  The incision may be closed with stitches (sutures).  A bandage (dressing) will be placed over the incision. The procedure may vary among health care providers and hospitals. What happens after the procedure?  Your blood pressure, heart rate, breathing rate, and blood oxygen level will be monitored until you leave the hospital or clinic.  If you were prescribed an antibiotic medicine, use it as told by your health care provider. Do not stop using the antibiotic even if you start to feel better.  If you were given a sedative during the procedure, it can affect you for several hours. Do not drive or operate machinery until your health care provider says that it is safe. Summary  Before the procedure, follow instructions from your health care provider about eating and drinking, and changing or stopping your regular medicines. This is especially important if you are taking diabetes medicines or blood thinners.  After the lipoma is removed, the incision may be closed with stitches (sutures) and covered with a bandage (dressing).  If you were given a sedative during the procedure, it can affect you for several hours. Do not drive or operate machinery until your health care provider says that it is safe. This information is not intended to replace advice given to you by your health care provider. Make sure you discuss any questions you have with your health care provider. Document Revised: 09/12/2018 Document Reviewed: 09/12/2018 Elsevier Patient Education  [place].

## 2019-07-18 NOTE — Progress Notes (Signed)
Patient ID: Gregory Hicks, male   DOB: Nov 01, 1975, 44 y.o.   MRN: [id number]  HPI Gregory Hicks is a 44 y.o. male in in consultation at the request of Mrs. Arnett FNP.  He reports that he has for several years a soft cutaneous soft tissue mass on the left thigh.  He also reports that there is some intermittent discomfort.  This discomfort and mild pain worsens when walking.  The pain is mild intermittent.  No other specific aggravating or elevating factors.  He also reports a new lump on the right back this does not hurt.  He did have an ultrasound of the thyroid that I personally reviewed dated in 2018 showing soft tissue str structure consistent with a lipoma.  However it only measures 4 x 5.6 cm.  CBC and CMP is completely normal.  He is able to perform more than 4 METS of activity without any shortness of breath or chest pain.  HPI  Past Medical History:  Diagnosis Date  . Depression   . Hypertension     Past Surgical History:  Procedure Laterality Date  . MOLE REMOVAL    . TONSILECTOMY, ADENOIDECTOMY, BILATERAL MYRINGOTOMY AND TUBES      Family History  Problem Relation Age of Onset  . Arthritis Mother   . Hypertension Mother   . Arthritis Father   . Hypertension Father   . Depression Maternal Aunt   . Depression Maternal Uncle   . Depression Paternal Aunt   . Depression Paternal Uncle   . Cancer Maternal Grandmother        breast  . Mental illness Maternal Grandmother   . Depression Maternal Grandmother   . Diabetes Maternal Grandmother   . Heart disease Maternal Grandfather   . Depression Maternal Grandfather   . Diabetes Maternal Grandfather   . Cancer Paternal Grandmother        breast  . Depression Paternal Grandmother   . Depression Paternal Grandfather   . Colon cancer Neg Hx   . Prostate cancer Neg Hx     Social History Social History   Tobacco Use  . Smoking status: Never Smoker  . Smokeless tobacco: Never Used  Substance Use Topics  . Alcohol use: Yes     Comment: beer once a month  . Drug use: No    Allergies  Allergen Reactions  . Penicillins Rash    Current Outpatient Medications  Medication Sig Dispense Refill  . Cholecalciferol 1.25 MG (50000 UT) TABS 50,000 units PO qwk for 8 weeks. 8 tablet 0   No current facility-administered medications for this visit.     Review of Systems Full ROS  was asked and was negative except for the information on the HPI  Physical Exam Blood pressure (!) 170/91, pulse (!) 122, temperature 97.7 F (36.5 C), resp. rate 12, height 6\' 4"  (1.93 m), weight 275 lb 6.4 oz (124.9 kg), SpO2 96 %. CONSTITUTIONAL: NAD EYES: Pupils are equal, round,  Sclera are non-icteric. EARS, NOSE, MOUTH AND THROAT: He is wearing a mask hearing is intact to voice. LYMPH NODES:  Lymph nodes in the neck are normal. RESPIRATORY:  Lungs are clear. There is normal respiratory effort, with equal breath sounds bilaterally, and without pathologic use of accessory muscles. CARDIOVASCULAR: Heart is regular without murmurs, gallops, or rubs. GI: The abdomen is  soft, nontender, and nondistended. There are no palpable masses. There is no hepatosplenomegaly. There are normal bowel sounds in all quadrants. GU: Rectal deferred.   MUSCULOSKELETAL: Normal  muscle strength and tone. No cyanosis or edema.   SKIN: There is evidence of left medial thigh subcutaneous soft tissue mass measuring in greatest diameter 12 cm x 10 cm.  It is  Bilobar.  Consistent with lipoma. Is a second lesion right paraspinal region measuring approximately 4 and half centimeters consistent with a lipoma as well. Rectal lesion located in the right NEUROLOGIC: Motor and sensation is grossly normal. Cranial nerves are grossly intact. PSYCH:  Oriented to person, place and time. Affect is normal.  Data Reviewed  I have personally reviewed the patient's imaging, laboratory findings and medical records.    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  Given the increase in size as compared to ultrasound and the significant  diameter I would like to obtain a CT scan to characterize the nature of this lesion.  More than likely this is a lipoma but I want make sure that we are not dealing with a liposarcoma.  We also discussed the potential for removing the lesion here in the office and he is agreeable to doing it under local.  He was also asking about removal of the back lesion but given the size of the thigh I do not think it is prudent to do it under local given the potential toxicity that the excision of both lesions may require as far as anesthetic.. A copy of this report was sent to the referring provider    Caroleen Hamman, MD FACS General Surgeon 07/18/2019, 6:36 PM

## 2019-07-24 ENCOUNTER — Ambulatory Visit
Admission: RE | Admit: 2019-07-24 | Discharge: 2019-07-24 | Disposition: A | Discharge status: Home / Self Care | Payer: 59 | Source: Ambulatory Visit | Attending: Surgery | Admitting: Surgery

## 2019-07-24 ENCOUNTER — Other Ambulatory Visit: Payer: Self-pay

## 2019-07-24 DIAGNOSIS — R2241 Localized swelling, mass and lump, right lower limb: Secondary | ICD-10-CM | POA: Diagnosis not present

## 2019-07-24 DIAGNOSIS — D179 Benign lipomatous neoplasm, unspecified: Secondary | ICD-10-CM | POA: Diagnosis not present

## 2019-07-24 MED ORDER — IOHEXOL 350 MG/ML SOLN
100.0000 mL | Freq: Once | INTRAVENOUS | Status: AC | PRN
  Administered 2019-07-24: 100 mL via INTRAVENOUS

## 2019-07-25 ENCOUNTER — Telehealth: Payer: Self-pay

## 2019-07-25 NOTE — Telephone Encounter (Signed)
-----   Message from Jules Husbands, MD sent at 07/25/2019  7:52 AM EDT ----- Please let him know that the CT scan shows findings consistent w lipoma, He may schedule procedure in the office 45 minutes ----- Message ----- From: Interface, Rad Results In Sent: 07/24/2019   8:46 PM EDT To: Jules Husbands, MD

## 2019-07-25 NOTE — Telephone Encounter (Signed)
Left detailed message with CT results per Dr.Pabon. Instructed patient to give our office a call back if he had any questions or concerns.

## 2019-08-07 ENCOUNTER — Other Ambulatory Visit: Payer: Self-pay

## 2019-08-07 ENCOUNTER — Encounter: Payer: Self-pay | Admitting: Surgery

## 2019-08-07 ENCOUNTER — Ambulatory Visit (INDEPENDENT_AMBULATORY_CARE_PROVIDER_SITE_OTHER): Payer: 59 | Admitting: Surgery

## 2019-08-07 VITALS — BP 183/100 | HR 118 | Temp 97.5°F | Resp 12 | Ht 75.0 in | Wt 275.0 lb

## 2019-08-07 DIAGNOSIS — D1724 Benign lipomatous neoplasm of skin and subcutaneous tissue of left leg: Secondary | ICD-10-CM | POA: Diagnosis not present

## 2019-08-07 NOTE — Patient Instructions (Addendum)
Please see your follow up appointment listed below.   Today we have removed a Lipoma in our office. Please see information below regarding this type of tumor.  You are free to shower in 48 hours. This will be on 08/09/19.  You have glue on your skin and sutures under the skin. The glue will come off on it's own in 10-14 days. You may shower normally until this occurs but do not submerge.  Please use Tylenol or Ibuprofen for pain as needed.  We will see you back in 2 weeks to ensure that this has healed and to review the final pathology. Please see your appointment below. You may continue your regular activities right away but if you are having pain while doing something, stop what you are doing and try this activity once again in 3 days. Please call our office with any questions or concerns prior to your appointment.   Lipoma Removal Lipoma removal is a surgical procedure to remove a noncancerous (benign) tumor that is made up of fat cells (lipoma). Most lipomas are small and painless and do not require treatment. They can form in many areas of the body but are most common under the skin of the back, shoulders, arms, and thighs. You may need lipoma removal if you have a lipoma that is large, growing, or causing discomfort. Lipoma removal may also be done for cosmetic reasons. Tell a health care provider about:  Any allergies you have.  All medicines you are taking, including vitamins, herbs, eye drops, creams, and over-the-counter medicines.  Any problems you or family members have had with anesthetic medicines.  Any blood disorders you have.  Any surgeries you have had.  Any medical conditions you have.  Whether you are pregnant or may be pregnant. What are the risks? Generally, this is a safe procedure. However, problems may occur, including:  Infection.  Bleeding.  Allergic reactions to medicines.  Damage to nerves or blood vessels near the lipoma.  Scarring.  What  happens before the procedure? Staying hydrated Follow instructions from your health care provider about hydration, which may include:  Up to 2 hours before the procedure - you may continue to drink clear liquids, such as water, clear fruit juice, black coffee, and plain tea.  Eating and drinking restrictions Follow instructions from your health care provider about eating and drinking, which may include:  8 hours before the procedure - stop eating heavy meals or foods such as meat, fried foods, or fatty foods.  6 hours before the procedure - stop eating light meals or foods, such as toast or cereal.  6 hours before the procedure - stop drinking milk or drinks that contain milk.  2 hours before the procedure - stop drinking clear liquids.  Medicines  Ask your health care provider about: ? Changing or stopping your regular medicines. This is especially important if you are taking diabetes medicines or blood thinners. ? Taking medicines such as aspirin and ibuprofen. These medicines can thin your blood. Do not take these medicines before your procedure if your health care provider instructs you not to.  You may be given antibiotic medicine to help prevent infection. General instructions  Ask your health care provider how your surgical site will be marked or identified.  You will have a physical exam. Your health care provider will check the size of the lipoma and whether it can be moved easily.  You may have imaging tests, such as: ? X-rays. ? CT scan. ?  MRI.  Plan to have someone take you home from the hospital or clinic. What happens during the procedure?  To reduce your risk of infection: ? Your health care team will wash or sanitize their hands. ? Your skin will be washed with soap.  You will be given one or more of the following: ? A medicine to help you relax (sedative). ? A medicine to numb the area (local anesthetic). ? A medicine to make you fall asleep (general  anesthetic). ? A medicine that is injected into an area of your body to numb everything below the injection site (regional anesthetic).  An incision will be made over the lipoma or very near the lipoma. The incision may be made in a natural skin line or crease.  Tissues, nerves, and blood vessels near the lipoma will be moved out of the way.  The lipoma and the capsule that surrounds it will be separated from the surrounding tissues.  The lipoma will be removed.  The incision may be closed with stitches (sutures).  A bandage (dressing) will be placed over the incision. What happens after the procedure?  Do not drive for 24 hours if you received a sedative.  Your blood pressure, heart rate, breathing rate, and blood oxygen level will be monitored until the medicines you were given have worn off. This information is not intended to replace advice given to you by your health care provider. Make sure you discuss any questions you have with your health care provider. Document Released: 04/11/2015 Document Revised: 07/04/2015 Document Reviewed: 04/11/2015 Elsevier Interactive Patient Education  [employer].

## 2019-08-08 NOTE — Progress Notes (Signed)
Procedure Note 1.  Excision of 12 cm left inner thigh lipoma 2.  Complex closure of a 14 cm wound left thigh  Anesthesia: Lidocaine 1% with epi total of 35 cc  EBL: Minimal  Complications: None  Patient was explained about the risks, benefits and possible complications and a consent was obtained.  THe patient was placed in supine position with his legs frogged.  He was prepped and draped in the usual sterile fashion.  I performed an elliptical incision incorporating the lipoma.  The elliptical incision was needed due to the excess skin.  I was able to dissect the lipoma from adjacent subcutaneous tissue using Metzenbaum scissors.  Pressure was used for hemostasis.  I developed random cutaneous flaps to be able to take the tension of the repair. The wound was closed in a 3 layer fashion with multiple interrupted 3-0 Vicryl sutures and the skin was closed with a running Monocryl suture in a subcuticular fashion.  Dermabond was used.  There were no complications

## 2019-08-15 ENCOUNTER — Encounter: Payer: Self-pay | Admitting: Family

## 2019-08-15 DIAGNOSIS — E559 Vitamin D deficiency, unspecified: Secondary | ICD-10-CM

## 2019-08-16 ENCOUNTER — Other Ambulatory Visit: Payer: Self-pay

## 2019-08-16 ENCOUNTER — Encounter: Payer: Self-pay | Admitting: Surgery

## 2019-08-16 ENCOUNTER — Ambulatory Visit (INDEPENDENT_AMBULATORY_CARE_PROVIDER_SITE_OTHER): Payer: Self-pay | Admitting: Surgery

## 2019-08-16 VITALS — BP 158/103 | HR 114 | Temp 97.9°F | Ht 76.0 in | Wt 275.0 lb

## 2019-08-16 DIAGNOSIS — Z09 Encounter for follow-up examination after completed treatment for conditions other than malignant neoplasm: Secondary | ICD-10-CM

## 2019-08-16 NOTE — Patient Instructions (Signed)
   Follow-up with our office as needed.  Please call and ask to speak with a nurse if you develop questions or concerns.  

## 2019-08-17 NOTE — Telephone Encounter (Signed)
Patient called back and scheduled lab appt for 08/24/19

## 2019-08-17 NOTE — Progress Notes (Signed)
S/P lipoma excision.  Doing very well.  No fevers no chills a little bit of bruising due to the local anesthetic. Wound is healing well without infection.  Pathology discussed with him. RTC prn

## 2019-08-17 NOTE — Telephone Encounter (Signed)
Left patient a message to call office back to schedule lab appt

## 2019-08-24 ENCOUNTER — Other Ambulatory Visit: Payer: Self-pay

## 2019-08-24 ENCOUNTER — Other Ambulatory Visit (INDEPENDENT_AMBULATORY_CARE_PROVIDER_SITE_OTHER): Payer: 59

## 2019-08-24 DIAGNOSIS — E559 Vitamin D deficiency, unspecified: Secondary | ICD-10-CM

## 2019-08-24 LAB — VITAMIN D 25 HYDROXY (VIT D DEFICIENCY, FRACTURES): VITD: 46.71 ng/mL (ref 30.00–100.00)

## 2019-08-25 ENCOUNTER — Other Ambulatory Visit: Payer: Self-pay | Admitting: Family

## 2019-11-11 ENCOUNTER — Other Ambulatory Visit: Payer: Self-pay | Admitting: Internal Medicine

## 2020-07-16 ENCOUNTER — Other Ambulatory Visit: Payer: Self-pay

## 2020-07-16 ENCOUNTER — Ambulatory Visit: Payer: 59 | Admitting: Family

## 2020-07-16 ENCOUNTER — Encounter: Payer: Self-pay | Admitting: Family

## 2020-07-16 VITALS — BP 138/74 | HR 94 | Temp 98.5°F | Ht 76.0 in | Wt 301.4 lb

## 2020-07-16 DIAGNOSIS — I10 Essential (primary) hypertension: Secondary | ICD-10-CM | POA: Diagnosis not present

## 2020-07-16 DIAGNOSIS — Z0000 Encounter for general adult medical examination without abnormal findings: Secondary | ICD-10-CM

## 2020-07-16 DIAGNOSIS — R030 Elevated blood-pressure reading, without diagnosis of hypertension: Secondary | ICD-10-CM

## 2020-07-16 DIAGNOSIS — Z23 Encounter for immunization: Secondary | ICD-10-CM

## 2020-07-16 LAB — LIPID PANEL
Cholesterol: 157 mg/dL (ref 0–200)
HDL: 46.9 mg/dL (ref >39.00)
LDL Cholesterol: 90 mg/dL (ref 0–99)
NonHDL: 110.1
Total CHOL/HDL Ratio: 3
Triglycerides: 103 mg/dL (ref 0.0–149.0)
VLDL: 20.6 mg/dL (ref 0.0–40.0)

## 2020-07-16 LAB — COMPREHENSIVE METABOLIC PANEL
ALT: 29 U/L (ref 0–53)
AST: 23 U/L (ref 0–37)
Albumin: 4.8 g/dL (ref 3.5–5.2)
Alkaline Phosphatase: 55 U/L (ref 39–117)
BUN: 14 mg/dL (ref 6–23)
CO2: 27 mEq/L (ref 19–32)
Calcium: 9.6 mg/dL (ref 8.4–10.5)
Chloride: 100 mEq/L (ref 96–112)
Creatinine, Ser: 1.11 mg/dL (ref 0.40–1.50)
GFR: 81.71 mL/min (ref >60.00)
Glucose, Bld: 91 mg/dL (ref 70–99)
Potassium: 4.1 mEq/L (ref 3.5–5.1)
Sodium: 138 mEq/L (ref 135–145)
Total Bilirubin: 1.2 mg/dL (ref 0.2–1.2)
Total Protein: 7.2 g/dL (ref 6.0–8.3)

## 2020-07-16 LAB — CBC WITH DIFFERENTIAL/PLATELET
Basophils Absolute: 0 10*3/uL (ref 0.0–0.1)
Basophils Relative: 0.5 % (ref 0.0–3.0)
Eosinophils Absolute: 0 10*3/uL (ref 0.0–0.7)
Eosinophils Relative: 0.8 % (ref 0.0–5.0)
HCT: 47.6 % (ref 39.0–52.0)
Hemoglobin: 16 g/dL (ref 13.0–17.0)
Lymphocytes Relative: 29.3 % (ref 12.0–46.0)
Lymphs Abs: 1.5 10*3/uL (ref 0.7–4.0)
MCHC: 33.7 g/dL (ref 30.0–36.0)
MCV: 91 fl (ref 78.0–100.0)
Monocytes Absolute: 0.6 10*3/uL (ref 0.1–1.0)
Monocytes Relative: 10.9 % (ref 3.0–12.0)
Neutro Abs: 3.1 10*3/uL (ref 1.4–7.7)
Neutrophils Relative %: 58.5 % (ref 43.0–77.0)
Platelets: 206 10*3/uL (ref 150.0–400.0)
RBC: 5.23 Mil/uL (ref 4.22–5.81)
RDW: 13.4 % (ref 11.5–15.5)
WBC: 5.2 10*3/uL (ref 4.0–10.5)

## 2020-07-16 LAB — HEMOGLOBIN A1C: Hgb A1c MFr Bld: 5.3 % (ref 4.6–6.5)

## 2020-07-16 LAB — TSH: TSH: 2.08 u[IU]/mL (ref 0.35–4.50)

## 2020-07-16 LAB — VITAMIN D 25 HYDROXY (VIT D DEFICIENCY, FRACTURES): VITD: 22.46 ng/mL — ABNORMAL LOW (ref 30.00–100.00)

## 2020-07-16 MED ORDER — AMLODIPINE BESYLATE 2.5 MG PO TABS
2.5000 mg | ORAL_TABLET | Freq: Every day | ORAL | 1 refills | Status: DC
  Filled 2020-07-16: qty 90, 90d supply, fill #0

## 2020-07-16 NOTE — Progress Notes (Signed)
Subjective:    Patient ID: Gregory Hicks, male    DOB: 02-21-1975, 45 y.o.   MRN: [id number]  CC: Gregory Hicks is a 45 y.o. male who presents today for physical exam.    HPI: Feels well today  BP at home 132/86, 132/84, 133/79, 133/83,  128/86,  HR 79-84  No daily NSAIDs, decongestants. No salt indiscretion.   No cp, sob, ha  He has gained weight recently related to eating out more.               Colorectal  Cancer Screening: No early family history  Prostate Cancer Screening: No trouble urinary or urinary hesistancy. We will check psa at 45 years of age.   Lung Cancer Screening: No 30 year pack year history and > 50 years to 39 years.   Immunizations       Tetanus - due       Hepatitis C screening - Candidate for, consents Labs: Screening labs today. Exercise: Gets regular exercise with indoor cycling, 6 hours per week, and 3 days per weight training.   Alcohol use:  very occassional Smoking/tobacco use: Nonsmoker.     HISTORY:  Past Medical History:  Diagnosis Date  . Depression   . Hypertension     Past Surgical History:  Procedure Laterality Date  . MOLE REMOVAL    . TONSILECTOMY, ADENOIDECTOMY, BILATERAL MYRINGOTOMY AND TUBES     Family History  Problem Relation Age of Onset  . Arthritis Mother   . Hypertension Mother   . Arthritis Father   . Hypertension Father   . Depression Maternal Aunt   . Depression Maternal Uncle   . Depression Paternal Aunt   . Depression Paternal Uncle   . Cancer Maternal Grandmother        breast  . Mental illness Maternal Grandmother   . Depression Maternal Grandmother   . Diabetes Maternal Grandmother   . Heart disease Maternal Grandfather   . Depression Maternal Grandfather   . Diabetes Maternal Grandfather   . Cancer Paternal Grandmother        breast  . Depression Paternal Grandmother   . Depression Paternal Grandfather   . Colon cancer Neg Hx   . Prostate cancer Neg Hx       ALLERGIES:  Penicillins  No current outpatient medications on file prior to visit.   No current facility-administered medications on file prior to visit.    Social History   Tobacco Use  . Smoking status: Never Smoker  . Smokeless tobacco: Never Used  Substance Use Topics  . Alcohol use: Yes    Comment: beer once a month  . Drug use: No    Review of Systems  Constitutional: Negative for chills and fever.  HENT: Negative for congestion.   Respiratory: Negative for cough.   Cardiovascular: Negative for chest pain, palpitations and leg swelling.  Gastrointestinal: Negative for diarrhea, nausea and vomiting.  Musculoskeletal: Negative for myalgias.  Skin: Negative for rash.  Neurological: Negative for headaches.  Hematological: Negative for adenopathy.  Psychiatric/Behavioral: Negative for confusion.      Objective:    BP 138/74 (BP Location: Left Arm, Cuff Size: Large) Comment (Cuff Size): Bariatric cuff  Pulse 94   Temp 98.5 F (36.9 C) (Oral)   Ht 6\' 4"  (1.93 m)   Wt (!) 301 lb 6.4 oz (136.7 kg)   SpO2 97%   BMI 36.69 kg/m   BP Readings from Last 3 Encounters:  07/16/20 138/74  08/16/19 Marland Kitchen)  158/103  08/07/19 (!) 183/100   Wt Readings from Last 3 Encounters:  07/16/20 (!) 301 lb 6.4 oz (136.7 kg)  08/16/19 275 lb (124.7 kg)  08/07/19 275 lb (124.7 kg)    Physical Exam Vitals reviewed.  Constitutional:      Appearance: He is well-developed.  HENT:     Head: Normocephalic and atraumatic.     Right Ear: Hearing, tympanic membrane, ear canal and external ear normal. No decreased hearing noted. No drainage, swelling or tenderness. No middle ear effusion. Tympanic membrane is not injected, erythematous or bulging.     Left Ear: Hearing, tympanic membrane, ear canal and external ear normal. No decreased hearing noted. No drainage, swelling or tenderness.  No middle ear effusion. Tympanic membrane is not injected, erythematous or bulging.     Nose: Nose normal.     Right  Sinus: No maxillary sinus tenderness or frontal sinus tenderness.     Left Sinus: No maxillary sinus tenderness or frontal sinus tenderness.     Mouth/Throat:     Pharynx: Uvula midline. No oropharyngeal exudate or posterior oropharyngeal erythema.     Tonsils: No tonsillar abscesses.  Eyes:     General: Lids are normal. Lids are everted, no foreign bodies appreciated.     Conjunctiva/sclera: Conjunctivae normal.     Pupils: Pupils are equal, round, and reactive to light.     Comments: Normal fundus bilaterally   Neck:     Thyroid: No thyroid mass or thyromegaly.  Cardiovascular:     Rate and Rhythm: Regular rhythm.     Heart sounds: Normal heart sounds.  Pulmonary:     Effort: Pulmonary effort is normal. No respiratory distress.     Breath sounds: Normal breath sounds. No wheezing, rhonchi or rales.  Lymphadenopathy:     Head:     Right side of head: No submental, submandibular, tonsillar, preauricular, posterior auricular or occipital adenopathy.     Left side of head: No submental, submandibular, tonsillar, preauricular, posterior auricular or occipital adenopathy.     Cervical: No cervical adenopathy.  Skin:    General: Skin is warm and dry.  Neurological:     Mental Status: He is alert.     Cranial Nerves: No cranial nerve deficit.     Sensory: No sensory deficit.     Deep Tendon Reflexes:     Reflex Scores:      Bicep reflexes are 2+ on the right side and 2+ on the left side.      Patellar reflexes are 2+ on the right side and 2+ on the left side.    Comments: Grip equal and strong bilateral upper extremities. Gait strong and steady. Able to perform  finger-to-nose without difficulty.   Psychiatric:        Speech: Speech normal.        Behavior: Behavior normal.        Assessment & Plan:   Problem List Items Addressed This Visit      Cardiovascular and Mediastinum   Hypertension    No signs or symptoms of HTN emergency or urgency. Reassuring neurologic exam.  Elevated primarily in the office however at home persistently > 130/80. Discussed goal < 120/80. Discussed weight gain and he is very active. Advised to continue to focus on lifestyle modifications. We agreed to start amlodipine 2.5mg . He will monitor blood pressure at home.       Relevant Medications   amLODipine (NORVASC) 2.5 MG tablet     Other  Elevated blood pressure reading   Relevant Medications   amLODipine (NORVASC) 2.5 MG tablet   Routine physical examination - Primary    Encouraged continued exercise. Tdap given.       Relevant Orders   TSH   CBC with Differential/Platelet   Comprehensive metabolic panel   Hemoglobin A1c   Lipid panel   VITAMIN D 25 Hydroxy (Vit-D Deficiency, Fractures)   Hepatitis C antibody    Other Visit Diagnoses    Need for Tdap vaccination       Relevant Orders   Tdap vaccine greater than or equal to 7yo IM (Completed)       I have discontinued Sinclair Stepp's influenza vac split quadrivalent PF. I am also having him start on amLODipine.   Meds ordered this encounter  Medications  . amLODipine (NORVASC) 2.5 MG tablet    Sig: Take 1 tablet (2.5 mg total) by mouth daily.    Dispense:  90 tablet    Refill:  1    Order Specific Question:   Supervising Provider    Answer:   Crecencio Mc [[id number]]    Return precautions given.   Risks, benefits, and alternatives of the medications and treatment plan prescribed today were discussed, and patient expressed understanding.   Education regarding symptom management and diagnosis given to patient on AVS.   Continue to follow with Burnard Hawthorne, FNP for routine health maintenance.   [place] and I agreed with plan.   Mable Paris, FNP

## 2020-07-16 NOTE — Assessment & Plan Note (Signed)
Encouraged continued exercise. Tdap given.

## 2020-07-16 NOTE — Patient Instructions (Addendum)
Start amlodipine   It is imperative that you are seen AT least twice per year for labs and monitoring. Monitor blood pressure at home and me 5-6 reading on separate days. Goal is less than 120/80, based on newest guidelines, however we certainly want to be less than 130/80;  if persistently higher, please make sooner follow up appointment so we can recheck you blood pressure and manage/ adjust medications.   Health Maintenance, Male Adopting a healthy lifestyle and getting preventive care are important in promoting health and wellness. Ask your health care provider about:  The right schedule for you to have regular tests and exams.  Things you can do on your own to prevent diseases and keep yourself healthy. What should I know about diet, weight, and exercise? Eat a healthy diet  Eat a diet that includes plenty of vegetables, fruits, low-fat dairy products, and lean protein.  Do not eat a lot of foods that are high in solid fats, added sugars, or sodium.   Maintain a healthy weight Body mass index (BMI) is a measurement that can be used to identify possible weight problems. It estimates body fat based on height and weight. Your health care provider can help determine your BMI and help you achieve or maintain a healthy weight. Get regular exercise Get regular exercise. This is one of the most important things you can do for your health. Most adults should:  Exercise for at least 150 minutes each week. The exercise should increase your heart rate and make you sweat (moderate-intensity exercise).  Do strengthening exercises at least twice a week. This is in addition to the moderate-intensity exercise.  Spend less time sitting. Even light physical activity can be beneficial. Watch cholesterol and blood lipids Have your blood tested for lipids and cholesterol at 43 years of age, then have this test every 5 years. You may need to have your cholesterol levels checked more often if:  Your  lipid or cholesterol levels are high.  You are older than 43 years of age.  You are at high risk for heart disease. What should I know about cancer screening? Many types of cancers can be detected early and may often be prevented. Depending on your health history and family history, you may need to have cancer screening at various ages. This may include screening for:  Colorectal cancer.  Prostate cancer.  Skin cancer.  Lung cancer. What should I know about heart disease, diabetes, and high blood pressure? Blood pressure and heart disease  High blood pressure causes heart disease and increases the risk of stroke. This is more likely to develop in people who have high blood pressure readings, are of African descent, or are overweight.  Talk with your health care provider about your target blood pressure readings.  Have your blood pressure checked: ? Every 3-5 years if you are 9-52 years of age. ? Every year if you are 56 years old or older.  If you are between the ages of 79 and 64 and are a current or former smoker, ask your health care provider if you should have a one-time screening for abdominal aortic aneurysm (AAA). Diabetes Have regular diabetes screenings. This checks your fasting blood sugar level. Have the screening done:  Once every three years after age 67 if you are at a normal weight and have a low risk for diabetes.  More often and at a younger age if you are overweight or have a high risk for diabetes. What should I  know about preventing infection? Hepatitis B If you have a higher risk for hepatitis B, you should be screened for this virus. Talk with your health care provider to find out if you are at risk for hepatitis B infection. Hepatitis C Blood testing is recommended for:  Everyone born from [birth year] through 1963.  Anyone with known risk factors for hepatitis C. Sexually transmitted infections (STIs)  You should be screened each year for STIs, including  gonorrhea and chlamydia, if: ? You are sexually active and are younger than 43 years of age. ? You are older than 43 years of age and your health care provider tells you that you are at risk for this type of infection. ? Your sexual activity has changed since you were last screened, and you are at increased risk for chlamydia or gonorrhea. Ask your health care provider if you are at risk.  Ask your health care provider about whether you are at high risk for HIV. Your health care provider may recommend a prescription medicine to help prevent HIV infection. If you choose to take medicine to prevent HIV, you should first get tested for HIV. You should then be tested every 3 months for as long as you are taking the medicine. Follow these instructions at home: Lifestyle  Do not use any products that contain nicotine or tobacco, such as cigarettes, e-cigarettes, and chewing tobacco. If you need help quitting, ask your health care provider.  Do not use street drugs.  Do not share needles.  Ask your health care provider for help if you need support or information about quitting drugs. Alcohol use  Do not drink alcohol if your health care provider tells you not to drink.  If you drink alcohol: ? Limit how much you have to 0-2 drinks a day. ? Be aware of how much alcohol is in your drink. In the [place] one drink equals one 12 oz bottle of beer (355 mL), one 5 oz glass of wine (148 mL), or one 1 oz glass of hard liquor (44 mL). General instructions  Schedule regular health, dental, and eye exams.  Stay current with your vaccines.  Tell your health care provider if: ? You often feel depressed. ? You have ever been abused or do not feel safe at home. Summary  Adopting a healthy lifestyle and getting preventive care are important in promoting health and wellness.  Follow your health care provider's instructions about healthy diet, exercising, and getting tested or screened for  diseases.  Follow your health care provider's instructions on monitoring your cholesterol and blood pressure. This information is not intended to replace advice given to you by your health care provider. Make sure you discuss any questions you have with your health care provider. Document Revised: 01/19/2018 Document Reviewed: 01/19/2018 Elsevier Patient Education  2021 [employer].

## 2020-07-16 NOTE — Assessment & Plan Note (Signed)
No signs or symptoms of HTN emergency or urgency. Reassuring neurologic exam. Elevated primarily in the office however at home persistently > 130/80. Discussed goal < 120/80. Discussed weight gain and he is very active. Advised to continue to focus on lifestyle modifications. We agreed to start amlodipine 2.5mg . He will monitor blood pressure at home.

## 2020-07-17 LAB — HEPATITIS C ANTIBODY
Hepatitis C Ab: NONREACTIVE
SIGNAL TO CUT-OFF: 0.01 (ref <1.00)

## 2020-10-27 ENCOUNTER — Encounter: Payer: Self-pay | Admitting: Family

## 2020-10-28 ENCOUNTER — Other Ambulatory Visit: Payer: Self-pay

## 2020-10-28 ENCOUNTER — Telehealth: Payer: 59 | Admitting: Physician Assistant

## 2020-10-28 DIAGNOSIS — H1032 Unspecified acute conjunctivitis, left eye: Secondary | ICD-10-CM | POA: Diagnosis not present

## 2020-10-28 MED ORDER — POLYMYXIN B-TRIMETHOPRIM 10000-0.1 UNIT/ML-% OP SOLN
1.0000 [drp] | OPHTHALMIC | 0 refills | Status: DC
  Filled 2020-10-28: qty 10, 25d supply, fill #0

## 2020-10-28 NOTE — Progress Notes (Signed)

## 2020-11-30 IMAGING — CT CT FEMUR *L* W/ CM
3 of 6 series · 9 of 36 positions shown, 10 images · IV contrast (agent unspecified)
Comparison: Ultrasound dated 02/18/2016

CONTRAST:  100mL OMNIPAQUE IOHEXOL 350 MG/ML SOLN

CLINICAL DATA: Chronic progressive soft tissue mass in the left
groin.

EXAM:
CT OF THE LOWER RIGHT EXTREMITY WITH CONTRAST
TECHNIQUE: Multidetector CT imaging of the lower right extremity was performed
according to the standard protocol following intravenous contrast
administration.

[Series 4: axial bone lfov lower extremity 1.50 ax · axial · 0.48mm/px · z∈[-1866,-1673]mm · 2 of 726 slices shown, 3 images]
[im 242/726  soft-tissue]
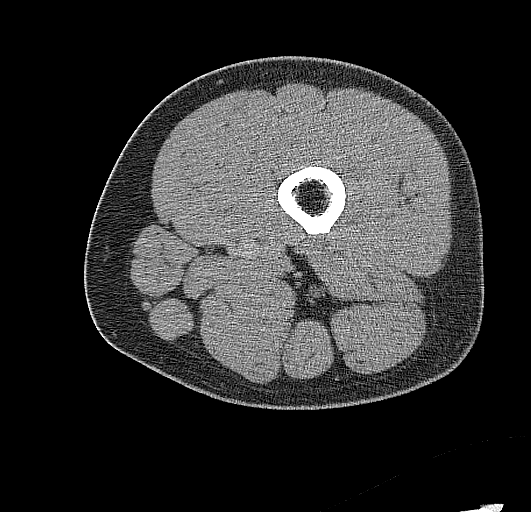
[im 242/726  bone]
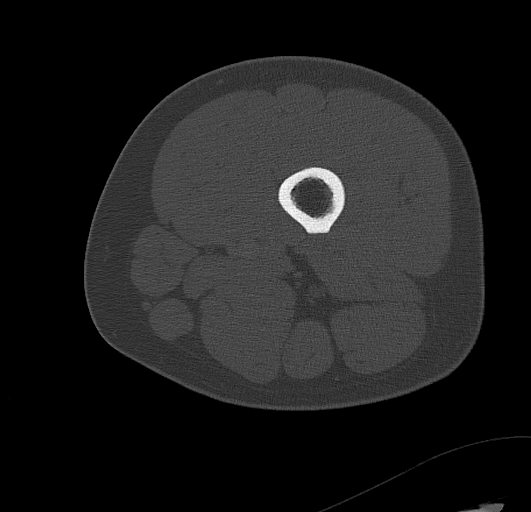
[im 484/726  bone]
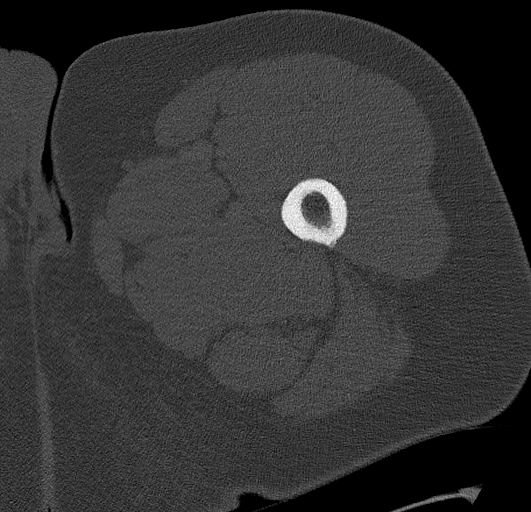

[Series 10: coronal bone lfov lower extremity 1.50 cor · coronal · 0.50mm/px · 1 of 307 slices shown]
[im 154/307  bone]
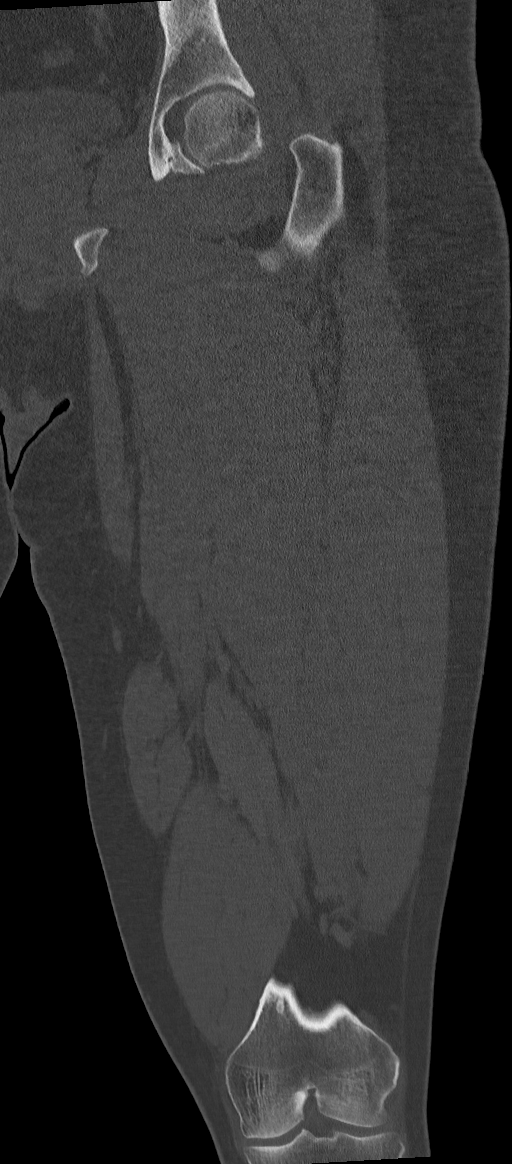

[Series 14: sagittal bone lfov lower extremity 1.50 sag · sagittal · 0.48mm/px · 6 of 319 slices shown]
[im 53/319  soft-tissue]
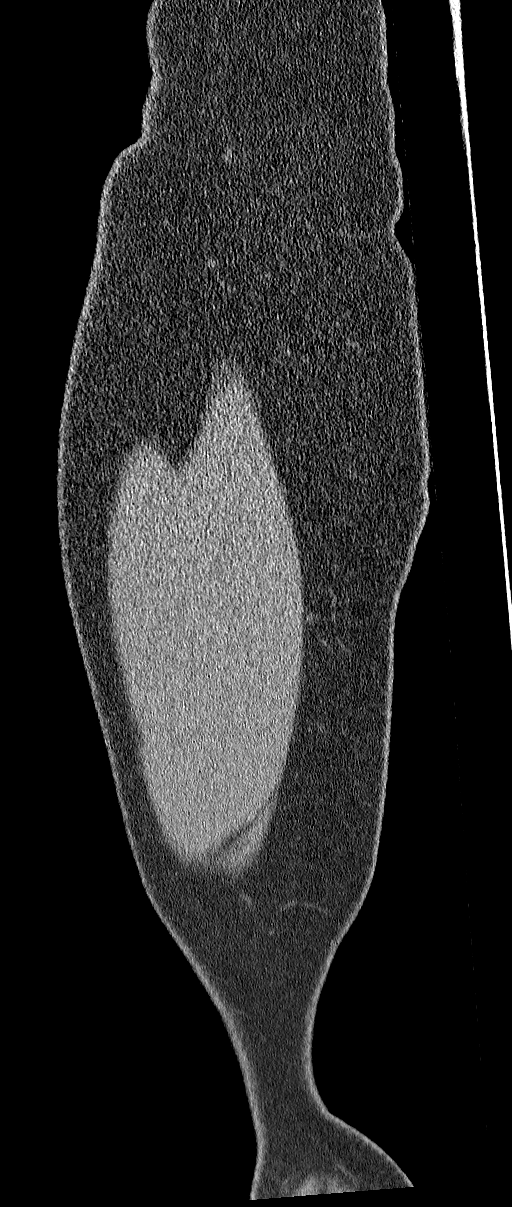
[im 54/319  bone]
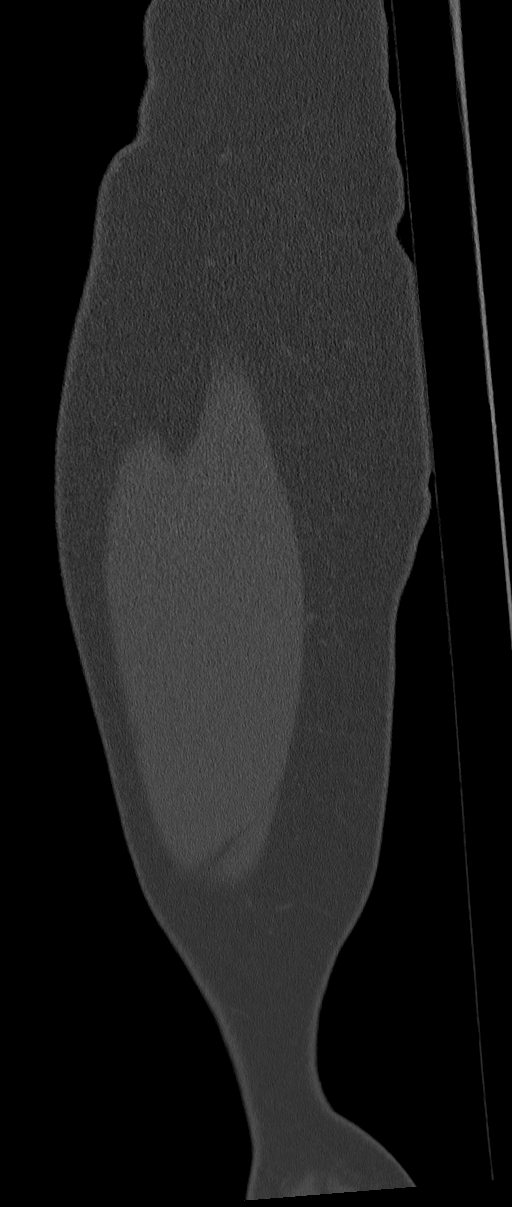
[im 107/319  bone]
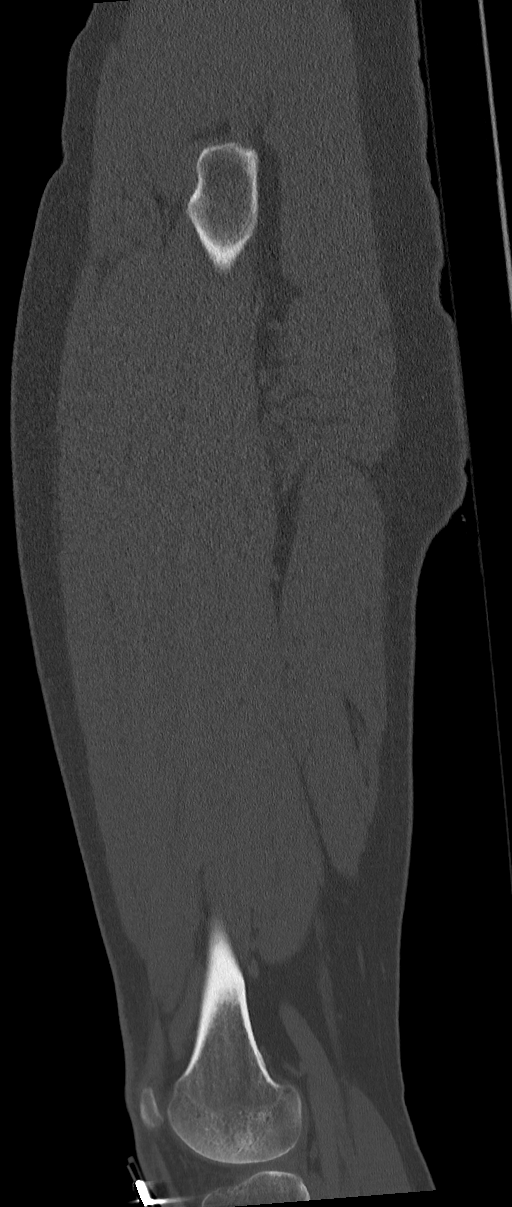
[im 160/319  bone]
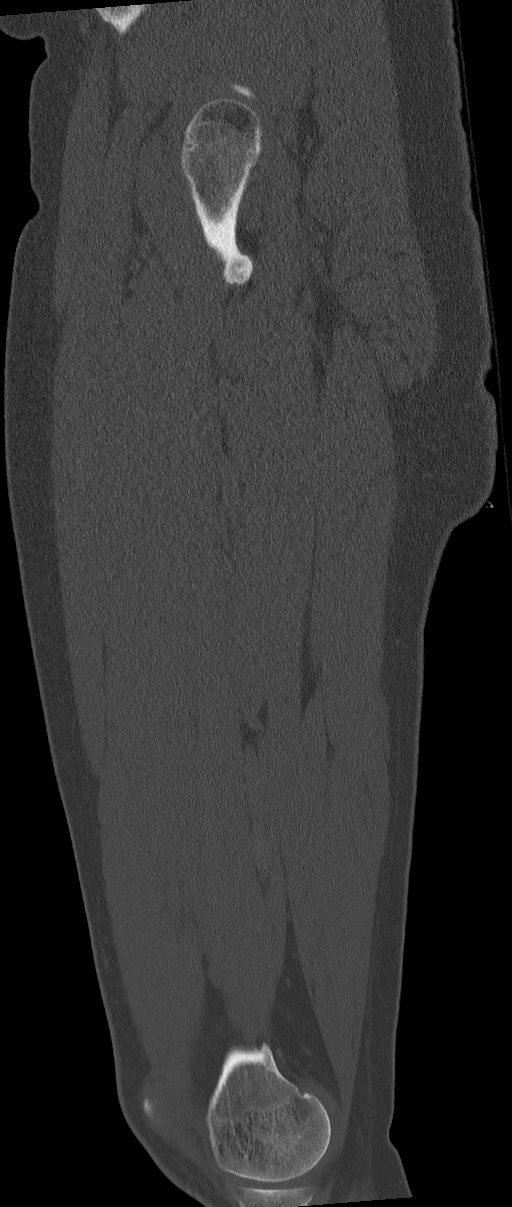
[im 213/319  bone]
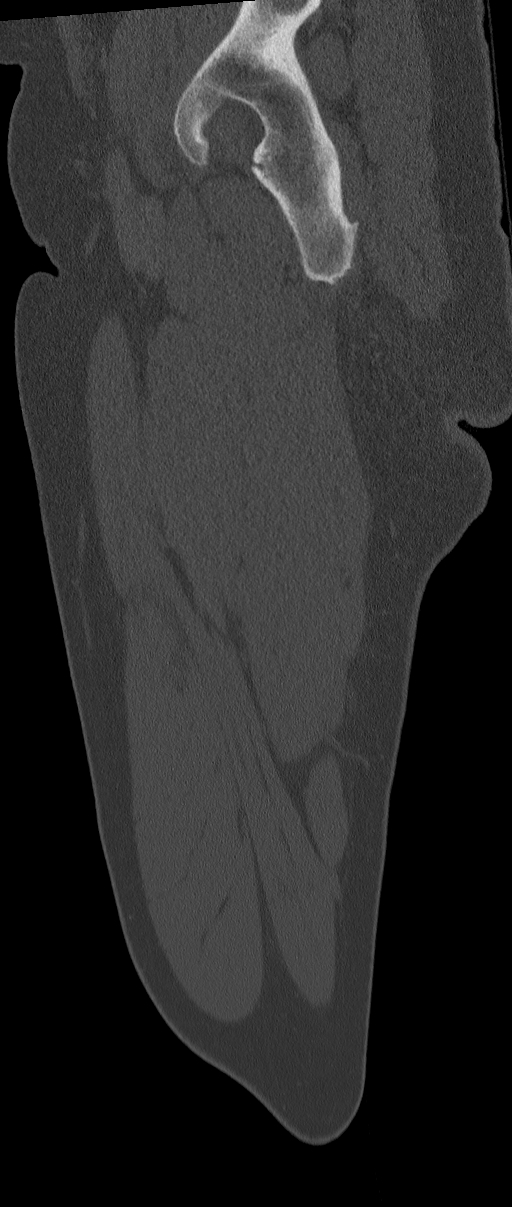
[im 266/319  bone]
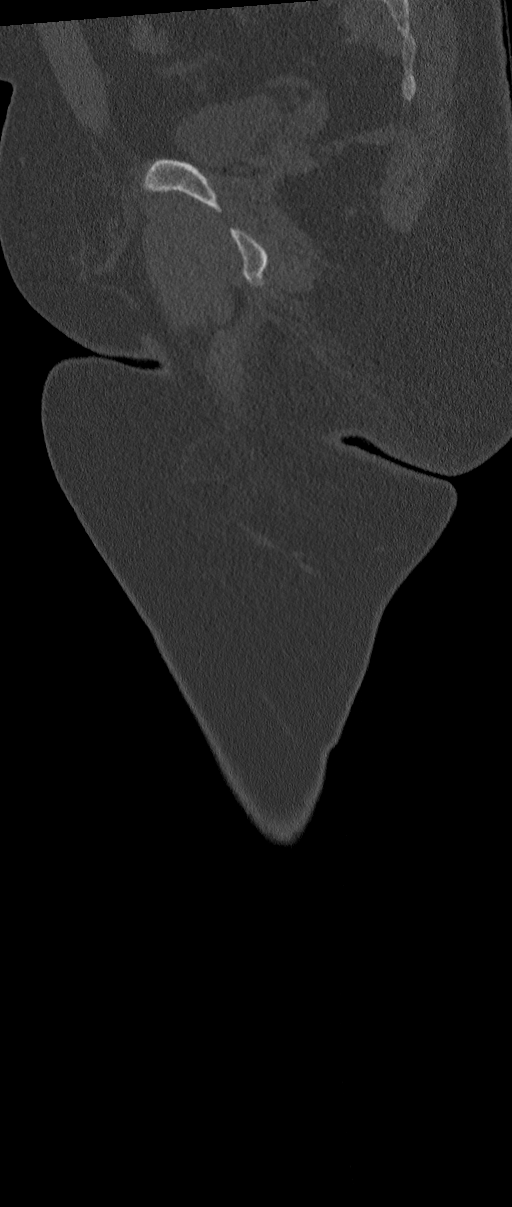

[9 of 36 positions shown; findings below may reference images not displayed]

FINDINGS: Bones/Joint/Cartilage

The left femur appears normal. Left hip joint and left knee joint
appear normal.

Ligaments

Ligaments of the left knee appear intact.

Muscles and Tendons

Normal.

Soft tissues

There is a poorly defined fatty mass in medial aspect left upper
thigh measuring approximately 14 x 11 x 5 cm. There is a small area
within the fatty mass measuring 2.5 x 2.3 x 2.2 cm with a fairly
well-defined capsule. There is no nodularity. The mass does not
extend into the underlying muscles.

No inguinal adenopathy. There is a small left inguinal hernia
containing only fat.
IMPRESSION: 1. Ill-defined fatty mass in the medial aspect of the left upper
thigh as described. This most likely represents a lipoma. However,
given the small complex component within the mass, this portion
could represent a low-grade liposarcoma.
2. Small left inguinal hernia containing only fat.

## 2021-01-23 DIAGNOSIS — H524 Presbyopia: Secondary | ICD-10-CM | POA: Diagnosis not present

## 2021-06-05 ENCOUNTER — Encounter: Payer: Self-pay | Admitting: Family

## 2021-06-09 DIAGNOSIS — Z0184 Encounter for antibody response examination: Secondary | ICD-10-CM | POA: Diagnosis not present

## 2022-03-12 ENCOUNTER — Emergency Department: Payer: 59

## 2022-03-12 ENCOUNTER — Encounter: Payer: Self-pay | Admitting: *Deleted

## 2022-03-12 ENCOUNTER — Emergency Department
Admission: EM | Admit: 2022-03-12 | Discharge: 2022-03-12 | Disposition: A | Discharge status: Home / Self Care | Payer: 59 | Attending: Emergency Medicine | Admitting: Emergency Medicine

## 2022-03-12 DIAGNOSIS — R002 Palpitations: Secondary | ICD-10-CM | POA: Diagnosis not present

## 2022-03-12 DIAGNOSIS — R000 Tachycardia, unspecified: Secondary | ICD-10-CM | POA: Diagnosis not present

## 2022-03-12 DIAGNOSIS — I10 Essential (primary) hypertension: Secondary | ICD-10-CM | POA: Diagnosis not present

## 2022-03-12 LAB — CBC
HCT: 49.8 % (ref 39.0–52.0)
Hemoglobin: 16.8 g/dL (ref 13.0–17.0)
MCH: 29.9 pg (ref 26.0–34.0)
MCHC: 33.7 g/dL (ref 30.0–36.0)
MCV: 88.6 fL (ref 80.0–100.0)
Platelets: 274 10*3/uL (ref 150–400)
RBC: 5.62 MIL/uL (ref 4.22–5.81)
RDW: 12.4 % (ref 11.5–15.5)
WBC: 8.8 10*3/uL (ref 4.0–10.5)
nRBC: 0 % (ref 0.0–0.2)

## 2022-03-12 LAB — HEPATIC FUNCTION PANEL
ALT: 43 U/L (ref 0–44)
AST: 30 U/L (ref 15–41)
Albumin: 4.5 g/dL (ref 3.5–5.0)
Alkaline Phosphatase: 58 U/L (ref 38–126)
Bilirubin, Direct: 0.1 mg/dL (ref 0.0–0.2)
Indirect Bilirubin: 0.7 mg/dL (ref 0.3–0.9)
Total Bilirubin: 0.8 mg/dL (ref 0.3–1.2)
Total Protein: 7.6 g/dL (ref 6.5–8.1)

## 2022-03-12 LAB — TSH: TSH: 3.654 u[IU]/mL (ref 0.350–4.500)

## 2022-03-12 LAB — BASIC METABOLIC PANEL
Anion gap: 10 (ref 5–15)
BUN: 13 mg/dL (ref 6–20)
CO2: 26 mmol/L (ref 22–32)
Calcium: 9 mg/dL (ref 8.9–10.3)
Chloride: 102 mmol/L (ref 98–111)
Creatinine, Ser: 1.01 mg/dL (ref 0.61–1.24)
GFR, Estimated: >60 mL/min (ref >60)
Glucose, Bld: 133 mg/dL — ABNORMAL HIGH (ref 70–99)
Potassium: 3.4 mmol/L — ABNORMAL LOW (ref 3.5–5.1)
Sodium: 138 mmol/L (ref 135–145)

## 2022-03-12 LAB — TROPONIN I (HIGH SENSITIVITY)
Troponin I (High Sensitivity): 4 ng/L (ref <18)
Troponin I (High Sensitivity): 5 ng/L (ref <18)

## 2022-03-12 LAB — T4, FREE: Free T4: 0.81 ng/dL (ref 0.61–1.12)

## 2022-03-12 LAB — MAGNESIUM: Magnesium: 1.9 mg/dL (ref 1.7–2.4)

## 2022-03-12 MED ORDER — POTASSIUM CHLORIDE CRYS ER 20 MEQ PO TBCR
40.0000 meq | EXTENDED_RELEASE_TABLET | Freq: Once | ORAL | Status: AC
  Administered 2022-03-12: 40 meq via ORAL
  Filled 2022-03-12: qty 2

## 2022-03-12 MED ORDER — SODIUM CHLORIDE 0.9 % IV BOLUS
1000.0000 mL | Freq: Once | INTRAVENOUS | Status: AC
  Administered 2022-03-12: 1000 mL via INTRAVENOUS

## 2022-03-12 NOTE — ED Notes (Signed)
Pt verbalizes understanding of discharge instructions. Opportunity for questioning and answers were provided. Pt discharged from ED to home with wife.    

## 2022-03-12 NOTE — ED Triage Notes (Signed)
Pt brought in via ems from the grocery store.  Pt reports while standing in line his heart started racing and beating hard.  No chest pain or sob.  Nonsmoker.  No cough.  No fever.  Pt alert  speech clear.

## 2022-03-12 NOTE — ED Provider Notes (Addendum)
[facility] Provider Note    Event Date/Time   First MD Initiated Contact with Patient 03/12/22 1733     (approximate)   History   Palpitations   HPI  Gregory Hicks is a 47 y.o. male who comes in with concerns for palpitations.  Patient reports that he was standing in line for checking on his groceries when he started to feel his heart racing and he looked down at his watch and noticed his heart rate get up to 168.  He denies ever having this happen previously he denied any chest pain or shortness of breath when it happened.  He states that he was able to sit down and the palpitations slowly resolved and by the time EMS got there his heart rate was already down to 120s to 130s.  He denies any history of known arrhythmias but does report that he has a lot of family history of people having some heart problems and having some face heart beats.  He does report a family member who has had SVT.  He does report working out but states that is no different than what his normal is.  He does report feeling a little bit dry but denies any nausea, vomiting.  Denies any shortness of breath or risk factors for pulmonary embolism.  He reports feeling much better at this time.   Physical Exam   Triage Vital Signs: ED Triage Vitals  Enc Vitals Group     BP 03/12/22 1702 (!) 170/98     Pulse Rate 03/12/22 1702 (!) 126     Resp 03/12/22 1702 18     Temp 03/12/22 1702 98 F (36.7 C)     Temp Source 03/12/22 1702 Oral     SpO2 03/12/22 1702 98 %     Weight 03/12/22 1703 (!) 301 lb (136.5 kg)     Height 03/12/22 1703 '6\' 4"'$  (1.93 m)     Head Circumference --      Peak Flow --      Pain Score 03/12/22 1707 0     Pain Loc --      Pain Edu? --      Excl. in [place]? --     Most recent vital signs: Vitals:   03/12/22 1702  BP: (!) 170/98  Pulse: (!) 126  Resp: 18  Temp: 98 F (36.7 C)  SpO2: 98%     General: Awake, no distress.  CV:  Good peripheral perfusion.   Tachycardic Resp:  Normal effort.  Abd:  No distention.  Soft nontender Other:  No swelling in legs.  No calf tenderness   ED Results / Procedures / Treatments   Labs (all labs ordered are listed, but only abnormal results are displayed) Labs Reviewed  BASIC METABOLIC PANEL - Abnormal; Notable for the following components:      Result Value   Potassium 3.4 (*)    Glucose, Bld 133 (*)    All other components within normal limits  CBC  TSH  T4, FREE  MAGNESIUM  HEPATIC FUNCTION PANEL  TROPONIN I (HIGH SENSITIVITY)     EKG  My interpretation of EKG:  Sinus tachycardia rate of 120 without any ST elevation or T wave inversions, normal intervals  Sinus rate of 95 without any ST elevation or T wave inversions, normal intervals  RADIOLOGY I have reviewed the xray personally and interpreted no pulmonary edema or enlarged heart  PROCEDURES:  Critical Care performed: No  .1-3 Lead EKG  Interpretation  Performed by: Vanessa [place], MD Authorized by: Vanessa [place], MD     Interpretation: abnormal     ECG rate:  105   ECG rate assessment: tachycardic     Rhythm: sinus tachycardia     Ectopy: none     Conduction: normal      MEDICATIONS ORDERED IN ED: Medications  sodium chloride 0.9 % bolus 1,000 mL (has no administration in time range)     IMPRESSION / MDM / ASSESSMENT AND PLAN / ED COURSE  I reviewed the triage vital signs and the nursing notes.   Patient's presentation is most consistent with acute presentation with potential threat to life or bodily function.   Patient comes in with concerns for elevated heart rates.  I wonder if patient could have had an episode of SVT versus A-fib.  Patient currently in sinus tach right now.  Labs ordered evaluate for any [place]ra, AKI, thyroid, ACS.  I considered possibility of pulmonary embolism but he reports the symptoms have since all resolved denies any shortness of breath risk factors for PE or any clinical signs of DVT  based upon examination therefore my suspicion is very low.  hepatic function test are normal.  BMP shows potassium of 3.4.  Patient was given some potassium.  CBC normal troponin negative.  Thyroid normal.  Repeat troponin is negative.  Patient remains asymptomatic denies any palpitations or shortness of breath feels at baseline and feels like comfortable with discharge home  The patient is on the cardiac monitor to evaluate for evidence of arrhythmia and/or significant heart rate changes.      FINAL CLINICAL IMPRESSION(S) / ED DIAGNOSES   Final diagnoses:  Palpitations     Rx / DC Orders   ED Discharge Orders          Ordered    Ambulatory referral to Cardiology        03/12/22 1901             Note:  This document was prepared using Dragon voice recognition software and may include unintentional dictation errors.   Vanessa Odell, MD 03/12/22 Lurline Hare    Vanessa [place], MD 03/12/22 Curly Rim    Vanessa Anna, MD 03/12/22 [phone]

## 2022-03-12 NOTE — ED Triage Notes (Signed)
Pt to ED via [facility] from grocery store. Pt reports palpitations and watch dinged HR showing 180s. Pt denies CP. EMS states HR 120-130s ST. BP elevated at 178/110. Pt taken off BP meds due to managing with weight loss.

## 2022-03-12 NOTE — Discharge Instructions (Addendum)
I suspect you had some type of arrhythmia today such as SVT or A-fib.  You need to follow-up with cardiology in order to get a Holter monitor.  Please call them to make an appointment and return to the ER if you develop return of symptoms or any other concerns.  Your blood pressure was slightly elevated so should follow-up for recheck with the cardiology team to see if you need to be restarted on any medications.

## 2022-03-14 ENCOUNTER — Other Ambulatory Visit: Payer: Self-pay | Admitting: Family

## 2022-03-14 DIAGNOSIS — R030 Elevated blood-pressure reading, without diagnosis of hypertension: Secondary | ICD-10-CM

## 2022-03-15 ENCOUNTER — Other Ambulatory Visit: Payer: Self-pay

## 2022-03-15 ENCOUNTER — Other Ambulatory Visit: Payer: Self-pay | Admitting: Family

## 2022-03-15 DIAGNOSIS — R030 Elevated blood-pressure reading, without diagnosis of hypertension: Secondary | ICD-10-CM

## 2022-03-16 ENCOUNTER — Other Ambulatory Visit: Payer: Self-pay

## 2022-03-18 ENCOUNTER — Other Ambulatory Visit: Payer: Self-pay

## 2022-04-27 DIAGNOSIS — I479 Paroxysmal tachycardia, unspecified: Secondary | ICD-10-CM | POA: Insufficient documentation

## 2022-04-27 NOTE — Progress Notes (Unsigned)
Cardiology Office Note  Date:  04/28/2022   ID:  Gregory Hicks, DOB Jun 27, 1975, MRN [id number]  PCP:  Burnard Hawthorne, FNP   Chief Complaint  Patient presents with   New Patient (Initial Visit)    Patient was at [facility] ER on 03/12/2022 with palpitations. Patient c/o a feeling in his throat with palpitations. Medications reviewed by the patient verbally.    HPI:  Mr. Gregory Hicks is a 47 year old gentleman with past medical history of Hypertension Who presents by referral from Dr. Jari Pigg for palpitations  Seen in the emergency room March 12, 2022 for palpitations Notes from the ER reviewed  standing in line for checking on his groceries when he started to feel his heart racing and he looked down at his watch and noticed his heart rate get up to 168.  He denies ever having this happen previously he denied any chest pain or shortness of breath when it happened.  He states that he was able to sit down and the palpitations slowly resolved and by the time EMS got there his heart rate was already down to 120s to 130s.  He denies any history of known arrhythmias but does report that he has a lot of family history of people having some heart problems and having some face heart beats.    He does report working out but states that is no different than what his normal is.  He does report feeling a little bit dry but denies any nausea, vomiting.  Denies any shortness of breath or risk factors for pulmonary embolism.  He reports feeling much better at this time.  Review of recent events when he was shopping, Described being in line at the store, felt tachy, rate 160s sustained Walked to wall, sat down, tachycardia kept going. Store called EMTs, with EMS rate 120-130 Stayed at 100 bpm Evaluation in the emergency room unrevealing  BP at home [id number] systolic  No prior episodes of tachycardia, none since then  EKG personally reviewed by myself on todays visit Normal sinus rhythm rate 107 bpm no  significant ST-T wave changes   PMH:   has a past medical history of Depression and Hypertension.  PSH:    Past Surgical History:  Procedure Laterality Date   MOLE REMOVAL     TONSILECTOMY, ADENOIDECTOMY, BILATERAL MYRINGOTOMY AND TUBES      Current Outpatient Medications  Medication Sig Dispense Refill   Multiple Vitamins-Minerals (MENS MULTIVITAMIN) TABS Take by mouth daily.     Vitamin D, Cholecalciferol, 25 MCG (1000 UT) TABS Take 1,000 Units by mouth daily.     No current facility-administered medications for this visit.     Allergies:   Penicillins   Social History:  The patient  reports that he has never smoked. He has never used smokeless tobacco. He reports that he does not currently use alcohol. He reports that he does not use drugs.   Family History:   family history includes Arthritis in his father and mother; Cancer in his maternal grandmother and paternal grandmother; Depression in his maternal aunt, maternal grandfather, maternal grandmother, maternal uncle, paternal aunt, paternal grandfather, paternal grandmother, and paternal uncle; Diabetes in his maternal grandfather and maternal grandmother; Heart disease in his maternal grandfather; Hypertension in his father and mother; Mental illness in his maternal grandmother.    Review of Systems: Review of Systems  Constitutional: Negative.   HENT: Negative.    Respiratory: Negative.    Cardiovascular:  Positive for palpitations.  Gastrointestinal: Negative.  Musculoskeletal: Negative.   Neurological: Negative.   Psychiatric/Behavioral: Negative.    All other systems reviewed and are negative.    PHYSICAL EXAM: VS:  BP (!) 160/80 (BP Location: Right Arm, Patient Position: Sitting, Cuff Size: Large)   Pulse (!) 107   Ht 6\' 4"  (1.93 m)   Wt (!) 325 lb (147.4 kg)   SpO2 98%   BMI 39.56 kg/m  , BMI Body mass index is 39.56 kg/m. GEN: Well nourished, well developed, in no acute distress HEENT: normal Neck:  no JVD, carotid bruits, or masses Cardiac: RRR; no murmurs, rubs, or gallops,no edema  Respiratory:  clear to auscultation bilaterally, normal work of breathing GI: soft, nontender, nondistended, + BS MS: no deformity or atrophy Skin: warm and dry, no rash Neuro:  Strength and sensation are intact Psych: euthymic mood, full affect   Recent Labs: 03/12/2022: ALT 43; BUN 13; Creatinine, Ser 1.01; Hemoglobin 16.8; Magnesium 1.9; Platelets 274; Potassium 3.4; Sodium 138; TSH 3.654    Lipid Panel Lab Results  Component Value Date   CHOL 157 07/16/2020   HDL 46.90 07/16/2020   LDLCALC 90 07/16/2020   TRIG 103.0 07/16/2020      Wt Readings from Last 3 Encounters:  04/28/22 (!) 325 lb (147.4 kg)  03/12/22 300 lb (136.1 kg)  07/16/20 (!) 301 lb 6.4 oz (136.7 kg)       ASSESSMENT AND PLAN:  Problem List Items Addressed This Visit       Cardiology Problems   Paroxysmal tachycardia ([place]) - Primary   Likely episode of SVT or atrial tachycardia, no precipitating factors can be identified No prior episodes, none since then Recommended Zio monitor for 2 weeks for further evaluation We have prescribed metoprolol succinate 25 mg daily to take after monitor complete Would suggest propranolol 20 mg as needed for any recurrent episodes of paroxysmal tachycardia Suggested he monitor blood pressure, metoprolol succinate dose could be titrated upwards as needed He has watch with pulse monitor to help track his rate Will hold off on additional testing such as echocardiogram given normal clinical exam and EKG We will call him with the results of his monitor   Total encounter time more than 50 minutes  Greater than 50% was spent in counseling and coordination of care with the patient    Signed, Esmond Plants, M.D., Ph.D. [place], [place]

## 2022-04-28 ENCOUNTER — Ambulatory Visit (INDEPENDENT_AMBULATORY_CARE_PROVIDER_SITE_OTHER): Payer: 59

## 2022-04-28 ENCOUNTER — Ambulatory Visit: Payer: 59 | Attending: Cardiovascular Disease | Admitting: Cardiovascular Disease

## 2022-04-28 ENCOUNTER — Encounter: Payer: Self-pay | Admitting: Cardiovascular Disease

## 2022-04-28 ENCOUNTER — Other Ambulatory Visit: Payer: Self-pay

## 2022-04-28 VITALS — BP 160/80 | HR 107 | Ht 76.0 in | Wt 325.0 lb

## 2022-04-28 DIAGNOSIS — I479 Paroxysmal tachycardia, unspecified: Secondary | ICD-10-CM

## 2022-04-28 MED ORDER — METOPROLOL SUCCINATE ER 25 MG PO TB24
25.0000 mg | ORAL_TABLET | Freq: Every day | ORAL | 3 refills | Status: DC
  Filled 2022-04-28: qty 90, 90d supply, fill #0
  Filled 2022-08-14: qty 90, 90d supply, fill #1
  Filled 2022-11-15: qty 90, 90d supply, fill #2
  Filled 2023-02-12: qty 90, 90d supply, fill #3

## 2022-04-28 MED ORDER — PROPRANOLOL HCL 20 MG PO TABS
20.0000 mg | ORAL_TABLET | ORAL | 3 refills | Status: DC | PRN
  Filled 2022-04-28: qty 90, 90d supply, fill #0

## 2022-04-28 NOTE — Patient Instructions (Addendum)
Zio monitor for paroxysmal tachycardia   Medication Instructions:  Please start metoprolol succinate 25 mg daily Take propranolol 20 mg daily as needed for tachycardia spell   If you need a refill on your cardiac medications before your next appointment, please call your pharmacy.   Lab work: No new labs needed  Testing/Procedures: Heart Monitor:  Length of Wear: 14 days  Your monitor will be mailed to your home address within 3-5 business days. However, if you have not received your monitor after 5 business days please send [place] a MyChart message or call the office at [phone], so we may follow up on this for you.   Your physician has recommended that you wear a Zio heart monitor.   This monitor is a medical device that records the heart's electrical activity. Doctors most often use these monitors to diagnose arrhythmias. Arrhythmias are problems with the speed or rhythm of the heartbeat. The monitor is a small device applied to your chest. You can wear one while you do your normal daily activities. While wearing this monitor if you have any symptoms to push the button and record what you felt. Once you have worn this monitor for the period of time provider prescribed (Usually 14 days), you will return the monitor device in the postage paid box. Once it is returned they will download the data collected and provide [place] with a report which the provider will then review and we will call you with those results. Important tips:  Avoid showering during the first 24 hours of wearing the monitor. Avoid excessive sweating to help maximize wear time. Do not submerge the device, no hot tubs, and no swimming pools. Keep any lotions or oils away from the patch. After 24 hours you may shower with the patch on. Take brief showers with your back facing the shower head.  Do not remove patch once it has been placed because that will interrupt data and decrease adhesive wear time. Push the button when  you have any symptoms and write down what you were feeling. Once you have completed wearing your monitor, remove and place into box which has postage paid and place in your outgoing mailbox.  If for some reason you have misplaced your box then call our office and we can provide another box and/or mail it off for you.      Follow-Up: At [facility], you and your health needs are our priority.  As part of our continuing mission to provide you with exceptional heart care, we have created designated Provider Care Teams.  These Care Teams include your primary Cardiologist (physician) and Advanced Practice Providers (APPs -  Physician Assistants and Nurse Practitioners) who all work together to provide you with the care you need, when you need it.  You will need a follow up appointment as needed  Providers on your designated Care Team:   Murray Hodgkins, NP Christell Faith, PA-C Cadence Kathlen Mody, [place]  COVID-19 Vaccine Information can be found at: [email or web address] For questions related to vaccine distribution or appointments, please email [email or web address] or call [phone].

## 2022-05-01 DIAGNOSIS — I479 Paroxysmal tachycardia, unspecified: Secondary | ICD-10-CM

## 2022-05-25 DIAGNOSIS — I479 Paroxysmal tachycardia, unspecified: Secondary | ICD-10-CM | POA: Diagnosis not present

## 2022-07-24 ENCOUNTER — Encounter: Payer: Self-pay | Admitting: Family

## 2022-08-21 ENCOUNTER — Encounter: Payer: Self-pay | Admitting: Family

## 2022-08-21 ENCOUNTER — Other Ambulatory Visit: Payer: Self-pay

## 2022-08-21 ENCOUNTER — Ambulatory Visit (INDEPENDENT_AMBULATORY_CARE_PROVIDER_SITE_OTHER): Payer: 59 | Admitting: Family

## 2022-08-21 VITALS — BP 126/66 | HR 102 | Temp 97.7°F | Ht 76.0 in | Wt 311.2 lb

## 2022-08-21 DIAGNOSIS — Z13220 Encounter for screening for lipoid disorders: Secondary | ICD-10-CM

## 2022-08-21 DIAGNOSIS — R42 Dizziness and giddiness: Secondary | ICD-10-CM

## 2022-08-21 DIAGNOSIS — Z0000 Encounter for general adult medical examination without abnormal findings: Secondary | ICD-10-CM

## 2022-08-21 DIAGNOSIS — Z1211 Encounter for screening for malignant neoplasm of colon: Secondary | ICD-10-CM

## 2022-08-21 DIAGNOSIS — Z125 Encounter for screening for malignant neoplasm of prostate: Secondary | ICD-10-CM | POA: Diagnosis not present

## 2022-08-21 DIAGNOSIS — I10 Essential (primary) hypertension: Secondary | ICD-10-CM | POA: Diagnosis not present

## 2022-08-21 DIAGNOSIS — Z8639 Personal history of other endocrine, nutritional and metabolic disease: Secondary | ICD-10-CM

## 2022-08-21 DIAGNOSIS — Z136 Encounter for screening for cardiovascular disorders: Secondary | ICD-10-CM | POA: Diagnosis not present

## 2022-08-21 LAB — CBC WITH DIFFERENTIAL/PLATELET
Basophils Absolute: 0 10*3/uL (ref 0.0–0.1)
Basophils Relative: 0.5 % (ref 0.0–3.0)
Eosinophils Absolute: 0.1 10*3/uL (ref 0.0–0.7)
Eosinophils Relative: 1.1 % (ref 0.0–5.0)
HCT: 48.9 % (ref 39.0–52.0)
Hemoglobin: 16 g/dL (ref 13.0–17.0)
Lymphocytes Relative: 35.5 % (ref 12.0–46.0)
Lymphs Abs: 1.9 10*3/uL (ref 0.7–4.0)
MCHC: 32.7 g/dL (ref 30.0–36.0)
MCV: 92.9 fl (ref 78.0–100.0)
Monocytes Absolute: 0.5 10*3/uL (ref 0.1–1.0)
Monocytes Relative: 9.7 % (ref 3.0–12.0)
Neutro Abs: 2.8 10*3/uL (ref 1.4–7.7)
Neutrophils Relative %: 53.2 % (ref 43.0–77.0)
Platelets: 227 10*3/uL (ref 150.0–400.0)
RBC: 5.27 Mil/uL (ref 4.22–5.81)
RDW: 13.6 % (ref 11.5–15.5)
WBC: 5.3 10*3/uL (ref 4.0–10.5)

## 2022-08-21 LAB — COMPREHENSIVE METABOLIC PANEL
ALT: 46 U/L (ref 0–53)
AST: 29 U/L (ref 0–37)
Albumin: 4.7 g/dL (ref 3.5–5.2)
Alkaline Phosphatase: 58 U/L (ref 39–117)
BUN: 12 mg/dL (ref 6–23)
CO2: 30 mEq/L (ref 19–32)
Calcium: 9.8 mg/dL (ref 8.4–10.5)
Chloride: 101 mEq/L (ref 96–112)
Creatinine, Ser: 1 mg/dL (ref 0.40–1.50)
GFR: 91.26 mL/min (ref >60.00)
Glucose, Bld: 98 mg/dL (ref 70–99)
Potassium: 3.9 mEq/L (ref 3.5–5.1)
Sodium: 140 mEq/L (ref 135–145)
Total Bilirubin: 1 mg/dL (ref 0.2–1.2)
Total Protein: 7.4 g/dL (ref 6.0–8.3)

## 2022-08-21 LAB — LIPID PANEL
Cholesterol: 164 mg/dL (ref 0–200)
HDL: 47.6 mg/dL (ref >39.00)
LDL Cholesterol: 97 mg/dL (ref 0–99)
NonHDL: 116.05
Total CHOL/HDL Ratio: 3
Triglycerides: 97 mg/dL (ref 0.0–149.0)
VLDL: 19.4 mg/dL (ref 0.0–40.0)

## 2022-08-21 LAB — PSA: PSA: 0.47 ng/mL (ref 0.10–4.00)

## 2022-08-21 LAB — TSH: TSH: 2.82 u[IU]/mL (ref 0.35–5.50)

## 2022-08-21 MED ORDER — MECLIZINE HCL 25 MG PO TABS
12.5000 mg | ORAL_TABLET | Freq: Three times a day (TID) | ORAL | 0 refills | Status: DC | PRN
  Filled 2022-08-21: qty 15, 10d supply, fill #0

## 2022-08-21 NOTE — Assessment & Plan Note (Signed)
Congratulated patient on diligence to exercise.  Referral for colonoscopy.

## 2022-08-21 NOTE — Assessment & Plan Note (Signed)
Description most consistent with BPPV.  Reassuring neurologic and HEENT exam today.  In the absence of alarm features, persistent symptoms or recurrent symptoms, provided patient with meclizine.  He will let me know how he is doing.  Encouraged him to consider trial of antihistamine daily to see if allergies are a trigger.  He will let me know how he is doing.

## 2022-08-21 NOTE — Patient Instructions (Addendum)
I suspect you are describing vertigo.  I provided you with a trial of meclizine. Please consider staying on Zyrtec more regularly.  Let me know of any concerns.   Health Maintenance, Male Adopting a healthy lifestyle and getting preventive care are important in promoting health and wellness. Ask your health care provider about: The right schedule for you to have regular tests and exams. Things you can do on your own to prevent diseases and keep yourself healthy. What should I know about diet, weight, and exercise? Eat a healthy diet  Eat a diet that includes plenty of vegetables, fruits, low-fat dairy products, and lean protein. Do not eat a lot of foods that are high in solid fats, added sugars, or sodium. Maintain a healthy weight Body mass index (BMI) is a measurement that can be used to identify possible weight problems. It estimates body fat based on height and weight. Your health care provider can help determine your BMI and help you achieve or maintain a healthy weight. Get regular exercise Get regular exercise. This is one of the most important things you can do for your health. Most adults should: Exercise for at least 150 minutes each week. The exercise should increase your heart rate and make you sweat (moderate-intensity exercise). Do strengthening exercises at least twice a week. This is in addition to the moderate-intensity exercise. Spend less time sitting. Even light physical activity can be beneficial. Watch cholesterol and blood lipids Have your blood tested for lipids and cholesterol at 45 years of age, then have this test every 5 years. You may need to have your cholesterol levels checked more often if: Your lipid or cholesterol levels are high. You are older than 45 years of age. You are at high risk for heart disease. What should I know about cancer screening? Many types of cancers can be detected early and may often be prevented. Depending on your health history and  family history, you may need to have cancer screening at various ages. This may include screening for: Colorectal cancer. Prostate cancer. Skin cancer. Lung cancer. What should I know about heart disease, diabetes, and high blood pressure? Blood pressure and heart disease High blood pressure causes heart disease and increases the risk of stroke. This is more likely to develop in people who have high blood pressure readings or are overweight. Talk with your health care provider about your target blood pressure readings. Have your blood pressure checked: Every 3-5 years if you are 65-11 years of age. Every year if you are 61 years old or older. If you are between the ages of 31 and 69 and are a current or former smoker, ask your health care provider if you should have a one-time screening for abdominal aortic aneurysm (AAA). Diabetes Have regular diabetes screenings. This checks your fasting blood sugar level. Have the screening done: Once every three years after age 30 if you are at a normal weight and have a low risk for diabetes. More often and at a younger age if you are overweight or have a high risk for diabetes. What should I know about preventing infection? Hepatitis B If you have a higher risk for hepatitis B, you should be screened for this virus. Talk with your health care provider to find out if you are at risk for hepatitis B infection. Hepatitis C Blood testing is recommended for: Everyone born from [birth year] through 1963. Anyone with known risk factors for hepatitis C. Sexually transmitted infections (STIs) You should be screened  each year for STIs, including gonorrhea and chlamydia, if: You are sexually active and are younger than 45 years of age. You are older than 45 years of age and your health care provider tells you that you are at risk for this type of infection. Your sexual activity has changed since you were last screened, and you are at increased risk for chlamydia or  gonorrhea. Ask your health care provider if you are at risk. Ask your health care provider about whether you are at high risk for HIV. Your health care provider may recommend a prescription medicine to help prevent HIV infection. If you choose to take medicine to prevent HIV, you should first get tested for HIV. You should then be tested every 3 months for as long as you are taking the medicine. Follow these instructions at home: Alcohol use Do not drink alcohol if your health care provider tells you not to drink. If you drink alcohol: Limit how much you have to 0-2 drinks a day. Know how much alcohol is in your drink. In the [place] one drink equals one 12 oz bottle of beer (355 mL), one 5 oz glass of wine (148 mL), or one 1 oz glass of hard liquor (44 mL). Lifestyle Do not use any products that contain nicotine or tobacco. These products include cigarettes, chewing tobacco, and vaping devices, such as e-cigarettes. If you need help quitting, ask your health care provider. Do not use street drugs. Do not share needles. Ask your health care provider for help if you need support or information about quitting drugs. General instructions Schedule regular health, dental, and eye exams. Stay current with your vaccines. Tell your health care provider if: You often feel depressed. You have ever been abused or do not feel safe at home. Summary Adopting a healthy lifestyle and getting preventive care are important in promoting health and wellness. Follow your health care provider's instructions about healthy diet, exercising, and getting tested or screened for diseases. Follow your health care provider's instructions on monitoring your cholesterol and blood pressure. This information is not intended to replace advice given to you by your health care provider. Make sure you discuss any questions you have with your health care provider. Document Revised: 06/17/2020 Document Reviewed: 06/17/2020 Elsevier  Patient Education  2024 [employer] Vertigo Vertigo is the feeling that you or your surroundings are moving when they are not. This feeling can come and go at any time. Vertigo often goes away on its own. Vertigo can be dangerous if it occurs while you are doing something that could endanger yourself or others, such as driving or operating machinery. Your health care provider will do tests to try to determine the cause of your vertigo. Tests will also help your health care provider decide how best to treat your condition. Follow these instructions at home: Eating and drinking     Dehydration can make vertigo worse. Drink enough fluid to keep your urine pale yellow. Do not drink alcohol. Activity Return to your normal activities as told by your health care provider. Ask your health care provider what activities are safe for you. In the morning, first sit up on the side of the bed. When you feel okay, stand slowly while you hold onto something until you know that your balance is fine. Move slowly. Avoid sudden body or head movements or certain positions, as told by your health care provider. If you have trouble walking or keeping your balance, try using a cane for  stability. If you feel dizzy or unstable, sit down right away. Avoid doing any tasks that would cause danger to you or others if vertigo occurs. Avoid bending down if you feel dizzy. Place items in your home so that they are easy for you to reach without bending or leaning over. Do not drive or use machinery if you feel dizzy. General instructions Take over-the-counter and prescription medicines only as told by your health care provider. Keep all follow-up visits. This is important. Contact a health care provider if: Your medicines do not relieve your vertigo or they make it worse. Your condition gets worse or you develop new symptoms. You have a fever. You develop nausea or vomiting, or if nausea gets worse. Your family or  friends notice any behavioral changes. You have numbness or a prickling and tingling sensation in part of your body. Get help right away if you: Are always dizzy or you faint. Develop severe headaches. Develop a stiff neck. Develop sensitivity to light. Have difficulty moving or speaking. Have weakness in your hands, arms, or legs. Have changes in your hearing or vision. These symptoms may represent a serious problem that is an emergency. Do not wait to see if the symptoms will go away. Get medical help right away. Call your local emergency services (911 in the [place] Do not drive yourself to the hospital. Summary Vertigo is the feeling that you or your surroundings are moving when they are not. Your health care provider will do tests to try to determine the cause of your vertigo. Follow instructions for home care. You may be told to avoid certain tasks, positions, or movements. Contact a health care provider if your medicines do not relieve your symptoms, or if you have a fever, nausea, vomiting, or changes in behavior. Get help right away if you have severe headaches or difficulty speaking, or you develop hearing or vision problems. This information is not intended to replace advice given to you by your health care provider. Make sure you discuss any questions you have with your health care provider. Document Revised: 12/27/2019 Document Reviewed: 12/27/2019 Elsevier Patient Education  2024 [employer].

## 2022-08-21 NOTE — Progress Notes (Signed)
Assessment & Plan:  Routine physical examination Assessment & Plan: Congratulated patient on diligence to exercise.  Referral for colonoscopy.  Orders: -     CBC with Differential/Platelet -     Comprehensive metabolic panel -     Lipid panel  Screening for colon cancer -     Ambulatory referral to Gastroenterology  Vertigo Assessment & Plan: Description most consistent with BPPV.  Reassuring neurologic and HEENT exam today.  In the absence of alarm features, persistent symptoms or recurrent symptoms, provided patient with meclizine.  He will let me know how he is doing.  Encouraged him to consider trial of antihistamine daily to see if allergies are a trigger.  He will let me know how he is doing.   Orders: -     CBC with Differential/Platelet -     Comprehensive metabolic panel -     TSH -     Meclizine HCl; Take 0.5 tablets (12.5 mg total) by mouth 3 (three) times daily as needed for dizziness.  Dispense: 15 tablet; Refill: 0  History of vitamin D deficiency  Encounter for lipid screening for cardiovascular disease -     Lipid panel  Screen for colon cancer  Screening for prostate cancer -     PSA  Hypertension, unspecified type -     CBC with Differential/Platelet -     Comprehensive metabolic panel -     TSH     Return precautions given.   Risks, benefits, and alternatives of the medications and treatment plan prescribed today were discussed, and patient expressed understanding.   Education regarding symptom management and diagnosis given to patient on AVS either electronically or printed.  Return in about 1 year (around 08/21/2023) for Complete Physical Exam.  Rennie Plowman, FNP  Subjective:    Patient ID: Gregory Hicks, male    DOB: 1975-11-28, 47 y.o.   MRN: [id number]  CC: Gregory Hicks is a 47 y.o. male who presents today for physical exam.    HPI: He complains of episode of 'vertigo' which occurred 6 months ago.  Episode occurred again in 3  months later. Aggravated by turning head left or right.   Denies vertigo today.    He felt room start to spin when he got up from couch, resolved within hours.   Denies a feeling of lightheadedness   14 day holter monitor 05/2022. He reports that he had an episode  of vertigo while wearing holter monitor.   Relief with epley's maneuver. His wife is a physical therapist.   Mom and sister both have BPPV.   Compliant with vitamin D 2000 international units  daily  Compliant with metoprolol succinate 25mg  daily. He has not had to use propranolol.  BP at home 126/66, HR 68 yesterday. BP averages 120/70 at home.   No sinus congestion, fever, vision loss, neck pian, tinnitus, hearing loss.   History of allergies. He takes zyrtec PRN.   Eye exam is up to date.   Colorectal  Cancer Screening: due next month Prostate Cancer Screening: No urinary hesitancy  Lung Cancer Screening: No 30 year pack year history and > 50 years to 80 years. Immunizations       Tetanus - UTD      Exercise: Gets regular exercise, stationary bike.  Alcohol use:  none Smoking/tobacco use: Nonsmoker.    Health Maintenance  Topic Date Due   COVID-19 Vaccine (4 - 2023-24 season) 10/10/2021   Flu Shot  09/10/2022   DTaP/Tdap/Td  vaccine (3 - Td or Tdap) 07/17/2030   Hepatitis C Screening  Completed   HIV Screening  Completed   HPV Vaccine  Aged Out     ALLERGIES: Penicillins  Current Outpatient Medications on File Prior to Visit  Medication Sig Dispense Refill   metoprolol succinate (TOPROL XL) 25 MG 24 hr tablet Take 1 tablet (25 mg total) by mouth daily. 90 tablet 3   Multiple Vitamins-Minerals (MENS MULTIVITAMIN) TABS Take by mouth daily.     propranolol (INDERAL) 20 MG tablet Take 1 tablet (20 mg total) by mouth as needed (for tachycardia spell). 90 tablet 3   Vitamin D, Cholecalciferol, 25 MCG (1000 UT) TABS Take 1,000 Units by mouth daily.     No current facility-administered medications on file  prior to visit.    Review of Systems  Constitutional:  Negative for chills and fever.  HENT:  Negative for congestion and sore throat.   Respiratory:  Negative for cough.   Cardiovascular:  Negative for chest pain, palpitations and leg swelling.  Gastrointestinal:  Negative for diarrhea, nausea and vomiting.  Genitourinary:  Negative for difficulty urinating.  Musculoskeletal:  Negative for myalgias.  Skin:  Negative for rash.  Neurological:  Negative for dizziness and numbness.  Hematological:  Negative for adenopathy.  Psychiatric/Behavioral:  Negative for confusion.       Objective:    BP 126/66   Pulse (!) 102   Temp 97.7 F (36.5 C) (Oral)   Ht 6\' 4"  (1.93 m)   Wt (!) 311 lb 3.2 oz (141.2 kg)   SpO2 98%   BMI 37.88 kg/m   BP Readings from Last 3 Encounters:  08/21/22 126/66  04/28/22 (!) 160/80  03/12/22 (!) 149/84   Wt Readings from Last 3 Encounters:  08/21/22 (!) 311 lb 3.2 oz (141.2 kg)  04/28/22 (!) 325 lb (147.4 kg)  03/12/22 300 lb (136.1 kg)    Physical Exam Vitals reviewed.  Constitutional:      Appearance: He is well-developed.  HENT:     Head: Normocephalic and atraumatic.     Right Ear: Hearing, tympanic membrane, ear canal and external ear normal. No decreased hearing noted. No drainage, swelling or tenderness. No middle ear effusion. Tympanic membrane is not injected, erythematous or bulging.     Left Ear: Hearing, tympanic membrane, ear canal and external ear normal. No decreased hearing noted. No drainage, swelling or tenderness.  No middle ear effusion. Tympanic membrane is not injected, erythematous or bulging.     Nose: Nose normal.     Right Sinus: No maxillary sinus tenderness or frontal sinus tenderness.     Left Sinus: No maxillary sinus tenderness or frontal sinus tenderness.     Mouth/Throat:     Pharynx: Uvula midline. No oropharyngeal exudate or posterior oropharyngeal erythema.     Tonsils: No tonsillar abscesses.  Eyes:      General: Lids are normal. Lids are everted, no foreign bodies appreciated.     Conjunctiva/sclera: Conjunctivae normal.     Pupils: Pupils are equal, round, and reactive to light.     Comments: Normal fundus bilaterally   Cardiovascular:     Rate and Rhythm: Regular rhythm.     Heart sounds: Normal heart sounds.  Pulmonary:     Effort: Pulmonary effort is normal. No respiratory distress.     Breath sounds: Normal breath sounds. No wheezing, rhonchi or rales.  Lymphadenopathy:     Head:     Right side of head: No  submental, submandibular, tonsillar, preauricular, posterior auricular or occipital adenopathy.     Left side of head: No submental, submandibular, tonsillar, preauricular, posterior auricular or occipital adenopathy.     Cervical: No cervical adenopathy.  Skin:    General: Skin is warm and dry.  Neurological:     Mental Status: He is alert.     Cranial Nerves: No cranial nerve deficit.     Sensory: No sensory deficit.     Deep Tendon Reflexes:     Reflex Scores:      Bicep reflexes are 2+ on the right side and 2+ on the left side.      Patellar reflexes are 2+ on the right side and 2+ on the left side.    Comments: Grip equal and strong bilateral upper extremities. Gait strong and steady. Able to perform  finger-to-nose without difficulty.   Psychiatric:        Speech: Speech normal.        Behavior: Behavior normal.

## 2022-08-24 ENCOUNTER — Other Ambulatory Visit: Payer: Self-pay

## 2022-08-31 ENCOUNTER — Encounter: Payer: Self-pay | Admitting: *Deleted

## 2022-09-01 ENCOUNTER — Telehealth: Payer: Self-pay | Admitting: *Deleted

## 2022-09-01 ENCOUNTER — Encounter: Payer: Self-pay | Admitting: *Deleted

## 2022-09-01 ENCOUNTER — Other Ambulatory Visit: Payer: Self-pay | Admitting: *Deleted

## 2022-09-01 ENCOUNTER — Other Ambulatory Visit: Payer: Self-pay

## 2022-09-01 ENCOUNTER — Telehealth: Payer: Self-pay

## 2022-09-01 DIAGNOSIS — Z1211 Encounter for screening for malignant neoplasm of colon: Secondary | ICD-10-CM

## 2022-09-01 MED ORDER — NA SULFATE-K SULFATE-MG SULF 17.5-3.13-1.6 GM/177ML PO SOLN
1.0000 | Freq: Once | ORAL | 0 refills | Status: AC
  Filled 2022-09-01: qty 354, fill #0
  Filled 2022-10-15: qty 354, 1d supply, fill #0

## 2022-09-01 NOTE — Telephone Encounter (Signed)
Colonoscopy schedule on 10/21/2022 with Dr Tobi Bastos

## 2022-09-01 NOTE — Telephone Encounter (Signed)
 Patient returned the call to schedule his colonoscopy.

## 2022-09-01 NOTE — Telephone Encounter (Signed)
Message left for patient to return my call.  

## 2022-09-01 NOTE — Telephone Encounter (Signed)
Gastroenterology Pre-Procedure Review  Request Date: 10/21/2022 Requesting Physician: Dr. Tobi Bastos  PATIENT REVIEW QUESTIONS: The patient responded to the following health history questions as indicated:    1. Are you having any GI issues? no 2. Do you have a personal history of Polyps? no 3. Do you have a family history of Colon Cancer or Polyps? no 4. Diabetes Mellitus? no 5. Joint replacements in the past 12 months?no 6. Major health problems in the past 3 months?no 7. Any artificial heart valves, MVP, or defibrillator?no    MEDICATIONS & ALLERGIES:    Patient reports the following regarding taking any anticoagulation/antiplatelet therapy:   Plavix, Coumadin, Eliquis, Xarelto, Lovenox, Pradaxa, Brilinta, or Effient? no Aspirin? no  Patient confirms/reports the following medications:  Current Outpatient Medications  Medication Sig Dispense Refill   meclizine (ANTIVERT) 25 MG tablet Take 0.5 tablets (12.5 mg total) by mouth 3 (three) times daily as needed for dizziness. 15 tablet 0   metoprolol succinate (TOPROL XL) 25 MG 24 hr tablet Take 1 tablet (25 mg total) by mouth daily. 90 tablet 3   Multiple Vitamins-Minerals (MENS MULTIVITAMIN) TABS Take by mouth daily.     propranolol (INDERAL) 20 MG tablet Take 1 tablet (20 mg total) by mouth as needed (for tachycardia spell). 90 tablet 3   Vitamin D, Cholecalciferol, 25 MCG (1000 UT) TABS Take 1,000 Units by mouth daily.     No current facility-administered medications for this visit.    Patient confirms/reports the following allergies:  Allergies  Allergen Reactions   Penicillins Rash    No orders of the defined types were placed in this encounter.   AUTHORIZATION INFORMATION Primary Insurance: 1D#: Group #:  Secondary Insurance: 1D#: Group #:  SCHEDULE INFORMATION: Date: 10/21/2022 Time: Location:  [facility]

## 2022-10-15 ENCOUNTER — Other Ambulatory Visit: Payer: Self-pay

## 2022-10-21 ENCOUNTER — Ambulatory Visit
Admission: RE | Admit: 2022-10-21 | Discharge: 2022-10-21 | Disposition: A | Discharge status: Home / Self Care | Payer: 59 | Attending: Gastroenterology | Admitting: Gastroenterology

## 2022-10-21 ENCOUNTER — Ambulatory Visit: Payer: 59

## 2022-10-21 ENCOUNTER — Encounter
Admission: RE | Disposition: A | Discharge status: Home / Self Care | Payer: Self-pay | Source: Home / Self Care | Attending: Gastroenterology

## 2022-10-21 DIAGNOSIS — D126 Benign neoplasm of colon, unspecified: Secondary | ICD-10-CM

## 2022-10-21 DIAGNOSIS — Z1211 Encounter for screening for malignant neoplasm of colon: Secondary | ICD-10-CM | POA: Insufficient documentation

## 2022-10-21 DIAGNOSIS — Z0000 Encounter for general adult medical examination without abnormal findings: Secondary | ICD-10-CM

## 2022-10-21 DIAGNOSIS — K635 Polyp of colon: Secondary | ICD-10-CM | POA: Diagnosis not present

## 2022-10-21 DIAGNOSIS — D128 Benign neoplasm of rectum: Secondary | ICD-10-CM | POA: Insufficient documentation

## 2022-10-21 HISTORY — PX: COLONOSCOPY WITH PROPOFOL: SHX5780

## 2022-10-21 SURGERY — COLONOSCOPY WITH PROPOFOL
Anesthesia: General

## 2022-10-21 MED ORDER — DEXMEDETOMIDINE HCL IN NACL 80 MCG/20ML IV SOLN
INTRAVENOUS | Status: DC | PRN
Start: 2022-10-21 — End: 2022-10-21
  Administered 2022-10-21: 8 ug via INTRAVENOUS

## 2022-10-21 MED ORDER — PROPOFOL 10 MG/ML IV BOLUS
INTRAVENOUS | Status: DC | PRN
  Administered 2022-10-21: 10 mg via INTRAVENOUS
  Administered 2022-10-21 (×3): 20 mg via INTRAVENOUS
  Administered 2022-10-21: 80 mg via INTRAVENOUS

## 2022-10-21 MED ORDER — DEXMEDETOMIDINE HCL IN NACL 80 MCG/20ML IV SOLN
INTRAVENOUS | Status: AC
  Filled 2022-10-21: qty 20

## 2022-10-21 MED ORDER — LIDOCAINE HCL (CARDIAC) PF 100 MG/5ML IV SOSY
PREFILLED_SYRINGE | INTRAVENOUS | Status: DC | PRN
  Administered 2022-10-21: 50 mg via INTRAVENOUS

## 2022-10-21 MED ORDER — PROPOFOL 500 MG/50ML IV EMUL
INTRAVENOUS | Status: DC | PRN
  Administered 2022-10-21: 75 ug/kg/min via INTRAVENOUS

## 2022-10-21 MED ORDER — SODIUM CHLORIDE 0.9 % IV SOLN
INTRAVENOUS | Status: DC
  Administered 2022-10-21: 20 mL/h via INTRAVENOUS

## 2022-10-21 NOTE — Transfer of Care (Signed)
Immediate Anesthesia Transfer of Care Note  Patient: [name]  Procedure(s) Performed: COLONOSCOPY WITH PROPOFOL  Patient Location: Endoscopy Unit  Anesthesia Type:MAC  Level of Consciousness: awake and patient cooperative  Airway & Oxygen Therapy: Patient Spontanous Breathing and Patient connected to face mask oxygen  Post-op Assessment: Report given to RN and Post -op Vital signs reviewed and stable  Post vital signs: Reviewed and stable  Last Vitals:  Vitals Value Taken Time  BP    Temp    Pulse 93 10/21/22 0842  Resp 22 10/21/22 0842  SpO2 98 % 10/21/22 0842  Vitals shown include unfiled device data.  Last Pain:  Vitals:   10/21/22 0730  TempSrc: Temporal  PainSc: 0-No pain         Complications: No notable events documented.

## 2022-10-21 NOTE — Anesthesia Procedure Notes (Signed)
Procedure Name: General with mask airway Date/Time: 10/21/2022 8:20 AM  Performed by: Lily Lovings, CRNAPre-anesthesia Checklist: Patient identified, Emergency Drugs available, Suction available and Patient being monitored Patient Re-evaluated:Patient Re-evaluated prior to induction Oxygen Delivery Method: Simple face mask Induction Type: IV induction Comments: POM mask applied

## 2022-10-21 NOTE — Op Note (Signed)
[facility] Gastroenterology Patient Name: Gregory Hicks Procedure Date: 10/21/2022 8:15 AM MRN: [id number] Account #: [id number] Date of Birth: [birth year] Admit Type: Outpatient Age: 47 Room: [facility] ENDO ROOM 3 Gender: Male Note Status: Finalized Instrument Name: Nelda Marseille [id number] Procedure:             Colonoscopy Indications:           Screening for colorectal malignant neoplasm Providers:             Wyline Mood MD, MD Referring MD:          Lyn Records. Arnett (Referring MD) Medicines:             Monitored Anesthesia Care Complications:         No immediate complications. Procedure:             Pre-Anesthesia Assessment:                        - Prior to the procedure, a History and Physical was                         performed, and patient medications, allergies and                         sensitivities were reviewed. The patient's tolerance                         of previous anesthesia was reviewed.                        - The risks and benefits of the procedure and the                         sedation options and risks were discussed with the                         patient. All questions were answered and informed                         consent was obtained.                        - ASA Grade Assessment: II - A patient with mild                         systemic disease.                        After obtaining informed consent, the colonoscope was                         passed under direct vision. Throughout the procedure,                         the patient's blood pressure, pulse, and oxygen                         saturations were monitored continuously. The                         Colonoscope was introduced  through the anus and                         advanced to the the cecum, identified by the                         appendiceal orifice. The colonoscopy was performed                         with ease. The patient tolerated the procedure well.                          The quality of the bowel preparation was good. The                         ileocecal valve, appendiceal orifice, and rectum were                         photographed. Findings:      The perianal and digital rectal examinations were normal.      A 10 mm polyp was found in the rectum. The polyp was pedunculated. The       polyp was removed with a hot snare. Resection and retrieval were       complete. To prevent bleeding post-intervention, one hemostatic clip was       successfully placed. There was no bleeding at the end of the procedure.      The exam was otherwise without abnormality on direct and retroflexion       views. Impression:            - One 10 mm polyp in the rectum, removed with a hot                         snare. Resected and retrieved. Clip was placed.                        - The examination was otherwise normal on direct and                         retroflexion views. Recommendation:        - Discharge patient to home (with escort).                        - Resume previous diet.                        - Continue present medications.                        - Await pathology results.                        - Repeat colonoscopy in 3 years for surveillance. 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 All rights reserved. The codes documented in this report are preliminary and upon coder review may  be revised to meet current compliance requirements. Wyline Mood, MD Wyline Mood MD, MD 10/21/2022 8:40:07 AM This report has been  signed electronically. Number of Addenda: 0 Note Initiated On: 10/21/2022 8:15 AM Scope Withdrawal Time: 0 hours 11 minutes 52 seconds  Total Procedure Duration: 0 hours 14 minutes 17 seconds  Estimated Blood Loss:  Estimated blood loss: none.      [facility]

## 2022-10-21 NOTE — Addendum Note (Signed)
Addendum  created 10/21/22 0954 by Lily Lovings, CRNA   Child order released for a procedure order, Clinical Note Signed, Intraprocedure Blocks edited, SmartForm saved

## 2022-10-21 NOTE — Anesthesia Preprocedure Evaluation (Signed)
Anesthesia Evaluation  Patient identified by MRN, date of birth, ID band Patient awake    Reviewed: Allergy & Precautions, NPO status , Patient's Chart, lab work & pertinent test results  History of Anesthesia Complications Negative for: history of anesthetic complications  Airway Mallampati: III  TM Distance: >3 FB Neck ROM: full    Dental no notable dental hx.    Pulmonary neg pulmonary ROS   Pulmonary exam normal        Cardiovascular hypertension, On Medications negative cardio ROS Normal cardiovascular exam     Neuro/Psych  PSYCHIATRIC DISORDERS  Depression    negative neurological ROS     GI/Hepatic negative GI ROS, Neg liver ROS,,,  Endo/Other  negative endocrine ROS    Renal/GU negative Renal ROS  negative genitourinary   Musculoskeletal   Abdominal   Peds  Hematology negative hematology ROS (+)   Anesthesia Other Findings Past Medical History: No date: Depression No date: Hypertension  Past Surgical History: No date: MOLE REMOVAL No date: TONSILECTOMY, ADENOIDECTOMY, BILATERAL MYRINGOTOMY AND TUBES  BMI    Body Mass Index: 36.78 kg/m      Reproductive/Obstetrics negative OB ROS                             Anesthesia Physical Anesthesia Plan  ASA: 2  Anesthesia Plan: General   Post-op Pain Management: Minimal or no pain anticipated   Induction: Intravenous  PONV Risk Score and Plan: 1 and Propofol infusion and TIVA  Airway Management Planned: Natural Airway and Nasal Cannula  Additional Equipment:   Intra-op Plan:   Post-operative Plan:   Informed Consent: I have reviewed the patients History and Physical, chart, labs and discussed the procedure including the risks, benefits and alternatives for the proposed anesthesia with the patient or authorized representative who has indicated his/her understanding and acceptance.     Dental Advisory Given  Plan  Discussed with: Anesthesiologist, CRNA and Surgeon  Anesthesia Plan Comments: (Patient consented for risks of anesthesia including but not limited to:  - adverse reactions to medications - risk of airway placement if required - damage to eyes, teeth, lips or other oral mucosa - nerve damage due to positioning  - sore throat or hoarseness - Damage to heart, brain, nerves, lungs, other parts of body or loss of life  Patient voiced understanding.)       Anesthesia Quick Evaluation

## 2022-10-21 NOTE — Anesthesia Postprocedure Evaluation (Signed)
Anesthesia Post Note  Patient: Gregory Hicks  Procedure(s) Performed: COLONOSCOPY WITH PROPOFOL  Patient location during evaluation: Endoscopy Anesthesia Type: General Level of consciousness: awake and alert Pain management: pain level controlled Vital Signs Assessment: post-procedure vital signs reviewed and stable Respiratory status: spontaneous breathing, nonlabored ventilation, respiratory function stable and patient connected to nasal cannula oxygen Cardiovascular status: blood pressure returned to baseline and stable Postop Assessment: no apparent nausea or vomiting Anesthetic complications: no   No notable events documented.   Last Vitals:  Vitals:   10/21/22 0730 10/21/22 0842  BP: (!) 190/119 123/75  Pulse: (!) 128   Resp: (!) 22   Temp: (!) 35.9 C (!) 36.1 C  SpO2: 98%     Last Pain:  Vitals:   10/21/22 0842  TempSrc: Temporal  PainSc: 0-No pain                 Louie Boston

## 2022-10-21 NOTE — H&P (Signed)
Wyline Mood, MD [address], [address], [place], [place], [address] [address], [place], [place], [address] Phone: [phone]  Fax: [phone]  Primary Care Physician:  Allegra Grana, FNP   Pre-Procedure History & Physical: HPI:  Gregory Hicks is a 47 y.o. male is here for an colonoscopy.   Past Medical History:  Diagnosis Date   Depression    Hypertension     Past Surgical History:  Procedure Laterality Date   MOLE REMOVAL     TONSILECTOMY, ADENOIDECTOMY, BILATERAL MYRINGOTOMY AND TUBES      Prior to Admission medications   Medication Sig Start Date End Date Taking? Authorizing Provider  meclizine (ANTIVERT) 25 MG tablet Take 0.5 tablets (12.5 mg total) by mouth 3 (three) times daily as needed for dizziness. 08/21/22  Yes Allegra Grana, FNP  metoprolol succinate (TOPROL XL) 25 MG 24 hr tablet Take 1 tablet (25 mg total) by mouth daily. 04/28/22  Yes Antonieta Iba, MD  Multiple Vitamins-Minerals (MENS MULTIVITAMIN) TABS Take by mouth daily.   Yes [provider]  propranolol (INDERAL) 20 MG tablet Take 1 tablet (20 mg total) by mouth as needed (for tachycardia spell). 04/28/22  Yes Antonieta Iba, MD  Vitamin D, Cholecalciferol, 25 MCG (1000 UT) TABS Take 1,000 Units by mouth daily.   Yes [provider]    Allergies as of 09/01/2022 - Review Complete 08/21/2022  Allergen Reaction Noted   Penicillins Rash 06/30/2017    Family History  Problem Relation Age of Onset   Arthritis Mother    Hypertension Mother    Arthritis Father    Hypertension Father    Depression Maternal Aunt    Depression Maternal Uncle    Depression Paternal Aunt    Depression Paternal Uncle    Cancer Maternal Grandmother        breast   Mental illness Maternal Grandmother    Depression Maternal Grandmother    Diabetes Maternal Grandmother    Heart disease Maternal Grandfather    Depression Maternal Grandfather    Diabetes Maternal  Grandfather    Cancer Paternal Grandmother        breast   Depression Paternal Grandmother    Depression Paternal Grandfather    Colon cancer Neg Hx    Prostate cancer Neg Hx     Social History   Socioeconomic History   Marital status: Married    Spouse name: Not on file   Number of children: Not on file   Years of education: Not on file   Highest education level: Not on file  Occupational History   Not on file  Tobacco Use   Smoking status: Never   Smokeless tobacco: Never  Vaping Use   Vaping status: Never Used  Substance and Sexual Activity   Alcohol use: Not Currently    Comment: beer once a month   Drug use: No   Sexual activity: Not on file  Other Topics Concern   Not on file  Social History Narrative   From [place]   Wife is patient   Daughter-5yo   Grad school in [place]   [employer] in [place]   Phd Biochemistry- self employed      Renea Ee, she is 45 years old.          Social Determinants of Health   Financial Resource Strain: Not on file  Food Insecurity: Not on file  Transportation Needs: Not on file  Physical Activity: Not on file  Stress: Not on file  Social Connections: Not on file  Intimate Partner Violence: Not on file    Review of Systems: See HPI, otherwise negative ROS  Physical Exam: BP (!) 190/119   Pulse (!) 128   Temp (!) 96.6 F (35.9 C) (Temporal)   Resp (!) 22   Ht 6\' 4"  (1.93 m)   Wt (!) 137.1 kg   SpO2 98%   BMI 36.78 kg/m  General:   Alert,  pleasant and cooperative in NAD Head:  Normocephalic and atraumatic. Neck:  Supple; no masses or thyromegaly. Lungs:  Clear throughout to auscultation, normal respiratory effort.    Heart:  +S1, +S2, Regular rate and rhythm, No edema. Abdomen:  Soft, nontender and nondistended. Normal bowel sounds, without guarding, and without rebound.   Neurologic:  Alert and  oriented x4;  grossly normal neurologically.  Impression/Plan: Gregory Hicks is here for an colonoscopy to be performed  for Screening colonoscopy average risk   Risks, benefits, limitations, and alternatives regarding  colonoscopy have been reviewed with the patient.  Questions have been answered.  All parties agreeable.   Wyline Mood, MD  10/21/2022, 7:48 AM

## 2022-10-22 ENCOUNTER — Encounter: Payer: Self-pay | Admitting: Gastroenterology

## 2022-10-22 LAB — SURGICAL PATHOLOGY

## 2022-11-19 ENCOUNTER — Other Ambulatory Visit: Payer: Self-pay

## 2022-11-19 ENCOUNTER — Telehealth: Payer: 59 | Admitting: Physician Assistant

## 2022-11-19 DIAGNOSIS — H00014 Hordeolum externum left upper eyelid: Secondary | ICD-10-CM

## 2022-11-19 MED ORDER — ERYTHROMYCIN 5 MG/GM OP OINT
1.0000 | TOPICAL_OINTMENT | Freq: Every day | OPHTHALMIC | 0 refills | Status: DC
Start: 2022-11-19 — End: 2022-11-19
  Filled 2022-11-19: qty 3.5, 3d supply, fill #0

## 2022-11-19 MED ORDER — POLYMYXIN B-TRIMETHOPRIM 10000-0.1 UNIT/ML-% OP SOLN
1.0000 [drp] | Freq: Four times a day (QID) | OPHTHALMIC | 0 refills | Status: DC
Start: 2022-11-19 — End: 2023-09-02
  Filled 2022-11-19: qty 10, 25d supply, fill #0

## 2022-11-19 NOTE — Addendum Note (Signed)
Addended by: Waldon Merl on: 11/19/2022 09:18 AM   Modules accepted: Orders

## 2022-11-19 NOTE — Progress Notes (Signed)
I have spent 5 minutes in review of e-visit questionnaire, review and updating patient chart, medical decision making and response to patient.   Mia Milan Cody Jacklynn Dehaas, PA-C    

## 2022-11-19 NOTE — Progress Notes (Signed)
  E-Visit for Stye   We are sorry that you are not feeling well. Here is how we plan to help!  Based on what you have shared with me it looks like you have a stye.  A stye is an inflammation of the eyelid.  It is often a red, painful lump near the edge of the eyelid that may look like a boil or a pimple.  A stye develops when an infection occurs at the base of an eyelash.   We have made appropriate suggestions for you based upon your presentation: Simple styes can be treated without medical intervention.  Most styes either resolve spontaneously or resolve with simple home treatment by applying warm compresses or heated washcloth to the stye for about 10-15 minutes three to four times a day. This causes the stye to drain and resolve. Giving active drainage, etc, I am adding on a topical antibiotic to use as directed.  HOME CARE:  Wash your hands often! Let the stye open on its own. Don't squeeze or open it. Don't rub your eyes. This can irritate your eyes and let in bacteria.  If you need to touch your eyes, wash your hands first. Don't wear eye makeup or contact lenses until the area has healed.  GET HELP RIGHT AWAY IF:  Your symptoms do not improve. You develop blurred or loss of vision. Your symptoms worsen (increased discharge, pain or redness).   Thank you for choosing an e-visit.  Your e-visit answers were reviewed by a board certified advanced clinical practitioner to complete your personal care plan. Depending upon the condition, your plan could have included both over the counter or prescription medications.  Please review your pharmacy choice. Make sure the pharmacy is open so you can pick up prescription now. If there is a problem, you may contact your provider through [employer] and have the prescription routed to another pharmacy.  Your safety is important to [place]. If you have drug allergies check your prescription carefully.   For the next 24 hours you can use MyChart  to ask questions about today's visit, request a non-urgent call back, or ask for a work or school excuse. You will get an email in the next two days asking about your experience. I hope that your e-visit has been valuable and will speed your recovery.

## 2022-12-15 DIAGNOSIS — H524 Presbyopia: Secondary | ICD-10-CM | POA: Diagnosis not present

## 2023-01-27 ENCOUNTER — Other Ambulatory Visit: Payer: Self-pay

## 2023-01-27 DIAGNOSIS — L821 Other seborrheic keratosis: Secondary | ICD-10-CM | POA: Diagnosis not present

## 2023-01-27 DIAGNOSIS — D2262 Melanocytic nevi of left upper limb, including shoulder: Secondary | ICD-10-CM | POA: Diagnosis not present

## 2023-01-27 DIAGNOSIS — L858 Other specified epidermal thickening: Secondary | ICD-10-CM | POA: Diagnosis not present

## 2023-01-27 DIAGNOSIS — D2271 Melanocytic nevi of right lower limb, including hip: Secondary | ICD-10-CM | POA: Diagnosis not present

## 2023-01-27 DIAGNOSIS — D225 Melanocytic nevi of trunk: Secondary | ICD-10-CM | POA: Diagnosis not present

## 2023-01-27 DIAGNOSIS — D2272 Melanocytic nevi of left lower limb, including hip: Secondary | ICD-10-CM | POA: Diagnosis not present

## 2023-01-27 DIAGNOSIS — D2261 Melanocytic nevi of right upper limb, including shoulder: Secondary | ICD-10-CM | POA: Diagnosis not present

## 2023-01-27 DIAGNOSIS — L718 Other rosacea: Secondary | ICD-10-CM | POA: Diagnosis not present

## 2023-01-27 MED ORDER — CLINDAMYCIN PHOSPHATE 1 % EX LOTN
1.0000 | TOPICAL_LOTION | Freq: Every day | CUTANEOUS | 2 refills | Status: DC
  Filled 2023-01-27: qty 60, 30d supply, fill #0
  Filled 2023-06-11: qty 60, 30d supply, fill #1
  Filled 2023-11-10: qty 60, 30d supply, fill #2

## 2023-05-03 ENCOUNTER — Other Ambulatory Visit: Payer: Self-pay | Admitting: Cardiovascular Disease

## 2023-05-04 ENCOUNTER — Other Ambulatory Visit: Payer: Self-pay

## 2023-05-04 MED ORDER — METOPROLOL SUCCINATE ER 25 MG PO TB24
25.0000 mg | ORAL_TABLET | Freq: Every day | ORAL | 3 refills | Status: AC
  Filled 2023-05-04: qty 90, 90d supply, fill #0
  Filled 2023-08-16: qty 90, 90d supply, fill #1
  Filled 2023-11-10: qty 90, 90d supply, fill #2
  Filled 2024-02-08 (×2): qty 90, 90d supply, fill #3

## 2023-06-02 DIAGNOSIS — L718 Other rosacea: Secondary | ICD-10-CM | POA: Diagnosis not present

## 2023-06-11 ENCOUNTER — Other Ambulatory Visit: Payer: Self-pay | Admitting: Cardiovascular Disease

## 2023-06-11 ENCOUNTER — Other Ambulatory Visit: Payer: Self-pay

## 2023-06-14 ENCOUNTER — Other Ambulatory Visit: Payer: Self-pay

## 2023-06-14 MED FILL — Propranolol HCl Tab 20 MG: ORAL | 90 days supply | Qty: 90 | Fill #0 | Status: AC

## 2023-09-01 ENCOUNTER — Encounter: Admitting: Family

## 2023-09-02 ENCOUNTER — Ambulatory Visit: Admitting: Family

## 2023-09-02 ENCOUNTER — Encounter: Payer: Self-pay | Admitting: Family

## 2023-09-02 ENCOUNTER — Telehealth: Payer: Self-pay

## 2023-09-02 VITALS — BP 124/66 | HR 63 | Temp 98.2°F | Ht 76.0 in | Wt 261.8 lb

## 2023-09-02 DIAGNOSIS — I10 Essential (primary) hypertension: Secondary | ICD-10-CM | POA: Diagnosis not present

## 2023-09-02 DIAGNOSIS — Z136 Encounter for screening for cardiovascular disorders: Secondary | ICD-10-CM | POA: Diagnosis not present

## 2023-09-02 DIAGNOSIS — E559 Vitamin D deficiency, unspecified: Secondary | ICD-10-CM | POA: Diagnosis not present

## 2023-09-02 DIAGNOSIS — Z13220 Encounter for screening for lipoid disorders: Secondary | ICD-10-CM

## 2023-09-02 DIAGNOSIS — H1012 Acute atopic conjunctivitis, left eye: Secondary | ICD-10-CM | POA: Diagnosis not present

## 2023-09-02 DIAGNOSIS — Z0000 Encounter for general adult medical examination without abnormal findings: Secondary | ICD-10-CM | POA: Diagnosis not present

## 2023-09-02 DIAGNOSIS — R3911 Hesitancy of micturition: Secondary | ICD-10-CM

## 2023-09-02 DIAGNOSIS — H1010 Acute atopic conjunctivitis, unspecified eye: Secondary | ICD-10-CM | POA: Insufficient documentation

## 2023-09-02 DIAGNOSIS — Z125 Encounter for screening for malignant neoplasm of prostate: Secondary | ICD-10-CM | POA: Diagnosis not present

## 2023-09-02 LAB — CBC WITH DIFFERENTIAL/PLATELET
Basophils Absolute: 0 K/uL (ref 0.0–0.1)
Basophils Relative: 0.5 % (ref 0.0–3.0)
Eosinophils Absolute: 0 K/uL (ref 0.0–0.7)
Eosinophils Relative: 0.6 % (ref 0.0–5.0)
HCT: 50.3 % (ref 39.0–52.0)
Hemoglobin: 16.7 g/dL (ref 13.0–17.0)
Lymphocytes Relative: 29.8 % (ref 12.0–46.0)
Lymphs Abs: 1.3 K/uL (ref 0.7–4.0)
MCHC: 33.1 g/dL (ref 30.0–36.0)
MCV: 91.4 fl (ref 78.0–100.0)
Monocytes Absolute: 0.3 K/uL (ref 0.1–1.0)
Monocytes Relative: 8.1 % (ref 3.0–12.0)
Neutro Abs: 2.6 K/uL (ref 1.4–7.7)
Neutrophils Relative %: 61 % (ref 43.0–77.0)
Platelets: 209 K/uL (ref 150.0–400.0)
RBC: 5.51 Mil/uL (ref 4.22–5.81)
RDW: 13.9 % (ref 11.5–15.5)
WBC: 4.3 K/uL (ref 4.0–10.5)

## 2023-09-02 LAB — LIPID PANEL
Cholesterol: 162 mg/dL (ref 0–200)
HDL: 55.8 mg/dL (ref >39.00)
LDL Cholesterol: 93 mg/dL (ref 0–99)
NonHDL: 105.97
Total CHOL/HDL Ratio: 3
Triglycerides: 63 mg/dL (ref 0.0–149.0)
VLDL: 12.6 mg/dL (ref 0.0–40.0)

## 2023-09-02 LAB — COMPREHENSIVE METABOLIC PANEL WITH GFR
ALT: 21 U/L (ref 0–53)
AST: 20 U/L (ref 0–37)
Albumin: 4.9 g/dL (ref 3.5–5.2)
Alkaline Phosphatase: 60 U/L (ref 39–117)
BUN: 10 mg/dL (ref 6–23)
CO2: 32 meq/L (ref 19–32)
Calcium: 9.6 mg/dL (ref 8.4–10.5)
Chloride: 101 meq/L (ref 96–112)
Creatinine, Ser: 1.05 mg/dL (ref 0.40–1.50)
GFR: 85.45 mL/min (ref >60.00)
Glucose, Bld: 91 mg/dL (ref 70–99)
Potassium: 4.4 meq/L (ref 3.5–5.1)
Sodium: 140 meq/L (ref 135–145)
Total Bilirubin: 1.1 mg/dL (ref 0.2–1.2)
Total Protein: 7.4 g/dL (ref 6.0–8.3)

## 2023-09-02 LAB — PSA: PSA: 0.47 ng/mL (ref 0.10–4.00)

## 2023-09-02 LAB — VITAMIN D 25 HYDROXY (VIT D DEFICIENCY, FRACTURES): VITD: 31.56 ng/mL (ref 30.00–100.00)

## 2023-09-02 LAB — TSH: TSH: 2.16 u[IU]/mL (ref 0.35–5.50)

## 2023-09-02 NOTE — Patient Instructions (Signed)
 Trial of Pataday , otc  antihistamine .  Let me know if doesn't resolve.   Health Maintenance, Male Adopting a healthy lifestyle and getting preventive care are important in promoting health and wellness. Ask your health care provider about: The right schedule for you to have regular tests and exams. Things you can do on your own to prevent diseases and keep yourself healthy. What should I know about diet, weight, and exercise? Eat a healthy diet  Eat a diet that includes plenty of vegetables, fruits, low-fat dairy products, and lean protein. Do not eat a lot of foods that are high in solid fats, added sugars, or sodium. Maintain a healthy weight Body mass index (BMI) is a measurement that can be used to identify possible weight problems. It estimates body fat based on height and weight. Your health care provider can help determine your BMI and help you achieve or maintain a healthy weight. Get regular exercise Get regular exercise. This is one of the most important things you can do for your health. Most adults should: Exercise for at least 150 minutes each week. The exercise should increase your heart rate and make you sweat (moderate-intensity exercise). Do strengthening exercises at least twice a week. This is in addition to the moderate-intensity exercise. Spend less time sitting. Even light physical activity can be beneficial. Watch cholesterol and blood lipids Have your blood tested for lipids and cholesterol at 46 years of age, then have this test every 5 years. You may need to have your cholesterol levels checked more often if: Your lipid or cholesterol levels are high. You are older than 46 years of age. You are at high risk for heart disease. What should I know about cancer screening? Many types of cancers can be detected early and may often be prevented. Depending on your health history and family history, you may need to have cancer screening at various ages. This may include  screening for: Colorectal cancer. Prostate cancer. Skin cancer. Lung cancer. What should I know about heart disease, diabetes, and high blood pressure? Blood pressure and heart disease High blood pressure causes heart disease and increases the risk of stroke. This is more likely to develop in people who have high blood pressure readings or are overweight. Talk with your health care provider about your target blood pressure readings. Have your blood pressure checked: Every 3-5 years if you are 46-34 years of age. Every year if you are 17 years old or older. If you are between the ages of 57 and 23 and are a current or former smoker, ask your health care provider if you should have a one-time screening for abdominal aortic aneurysm (AAA). Diabetes Have regular diabetes screenings. This checks your fasting blood sugar level. Have the screening done: Once every three years after age 71 if you are at a normal weight and have a low risk for diabetes. More often and at a younger age if you are overweight or have a high risk for diabetes. What should I know about preventing infection? Hepatitis B If you have a higher risk for hepatitis B, you should be screened for this virus. Talk with your health care provider to find out if you are at risk for hepatitis B infection. Hepatitis C Blood testing is recommended for: Everyone born from [birth year] through 1963. Anyone with known risk factors for hepatitis C. Sexually transmitted infections (STIs) You should be screened each year for STIs, including gonorrhea and chlamydia, if: You are sexually active and  are younger than 46 years of age. You are older than 46 years of age and your health care provider tells you that you are at risk for this type of infection. Your sexual activity has changed since you were last screened, and you are at increased risk for chlamydia or gonorrhea. Ask your health care provider if you are at risk. Ask your health care  provider about whether you are at high risk for HIV. Your health care provider may recommend a prescription medicine to help prevent HIV infection. If you choose to take medicine to prevent HIV, you should first get tested for HIV. You should then be tested every 3 months for as long as you are taking the medicine. Follow these instructions at home: Alcohol use Do not drink alcohol if your health care provider tells you not to drink. If you drink alcohol: Limit how much you have to 0-2 drinks a day. Know how much alcohol is in your drink. In the [place] one drink equals one 12 oz bottle of beer (355 mL), one 5 oz glass of wine (148 mL), or one 1 oz glass of hard liquor (44 mL). Lifestyle Do not use any products that contain nicotine or tobacco. These products include cigarettes, chewing tobacco, and vaping devices, such as e-cigarettes. If you need help quitting, ask your health care provider. Do not use street drugs. Do not share needles. Ask your health care provider for help if you need support or information about quitting drugs. General instructions Schedule regular health, dental, and eye exams. Stay current with your vaccines. Tell your health care provider if: You often feel depressed. You have ever been abused or do not feel safe at home. Summary Adopting a healthy lifestyle and getting preventive care are important in promoting health and wellness. Follow your health care provider's instructions about healthy diet, exercising, and getting tested or screened for diseases. Follow your health care provider's instructions on monitoring your cholesterol and blood pressure. This information is not intended to replace advice given to you by your health care provider. Make sure you discuss any questions you have with your health care provider. Document Revised: 06/17/2020 Document Reviewed: 06/17/2020 Elsevier Patient Education  2024 [employer].

## 2023-09-02 NOTE — Assessment & Plan Note (Signed)
 Chronic, controlled at home. Contine toprol  25mg  every day.

## 2023-09-02 NOTE — Assessment & Plan Note (Signed)
 Episodic. Pending UA, PSA Counseled on if symptom persists, worsens to arrange consult with urology.

## 2023-09-02 NOTE — Telephone Encounter (Signed)
 I left voicemail for patient asking him to please call us  to schedule visit we discussed at check-out.  E2C2 - when patient calls back, please ask him which day he will be bringing in his urine sample and schedule a lab visit for him that day.  We will need to have the lab visit on our schedule, the same day he drops off his urine sample, so we can process the sample.  To clarify, patient will just be dropping off his sample.

## 2023-09-02 NOTE — Assessment & Plan Note (Addendum)
 Colonoscopy is UTD. Congratulated patient on lifestyle changes , weight loss. He politely declines Hep B or HPV vaccination.

## 2023-09-02 NOTE — Progress Notes (Signed)
 Assessment & Plan:  Annual physical exam Assessment & Plan: Colonoscopy is UTD. Congratulated patient on lifestyle changes , weight loss. He politely declines Hep B or HPV vaccination.   Orders: -     Lipid panel -     Comprehensive metabolic panel with GFR -     CBC with Differential/Platelet -     VITAMIN D  25 Hydroxy (Vit-D Deficiency, Fractures) -     Urinalysis, Routine w reflex microscopic; Future  Screening for prostate cancer -     PSA -     Urinalysis, Routine w reflex microscopic; Future  Routine physical examination -     Lipid panel -     Comprehensive metabolic panel with GFR -     CBC with Differential/Platelet -     VITAMIN D  25 Hydroxy (Vit-D Deficiency, Fractures)  Encounter for lipid screening for cardiovascular disease -     Lipid panel  Hypertension, unspecified type Assessment & Plan: Chronic, controlled at home. Contine toprol  25mg  every day.   Orders: -     Comprehensive metabolic panel with GFR -     CBC with Differential/Platelet -     TSH  Vitamin D  deficiency -     VITAMIN D  25 Hydroxy (Vit-D Deficiency, Fractures)  Urinary hesitancy Assessment & Plan: Episodic. Pending UA, PSA Counseled on if symptom persists, worsens to arrange consult with urology.    Allergic conjunctivitis of left eye Assessment & Plan: Presentation c/w  allergic conjunctivitis, more bothersome in the left eye. Advised otc pataday. He will let me know if symptom doesn't resolve.       Return precautions given.   Risks, benefits, and alternatives of the medications and treatment plan prescribed today were discussed, and patient expressed understanding.   Education regarding symptom management and diagnosis given to patient on AVS either electronically or printed.  Return for Complete Physical Exam.  Rollene Northern, FNP  Subjective:    Patient ID: Gregory Hicks, male    DOB: 04/20/1975, 48 y.o.   MRN: [id number]  CC: Gregory Hicks is a 48 y.o. male who  presents today for physical exam.    HPI: Left eye itchy with clear discharge,for past 2 weeks  Eyelid painful when open and closes  Denies pain of the eye itself, eyelid lesion, HA, vision loss, photophobia, fever, sinus congestion,  He questions if allergies  He doesn't wear contacts. He has an eye exam once yearly.   He has tried cetirizine however felt heart rate increased, since stopped  He is tracking his calories and lost weight intentionally.   BP at home 63, 124/66  Max BP 130/70. 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  Denies CP, dizziness.   He is compliant with toprol  25mg    He has never had to use propranolol   Colorectal  Cancer Screening: UTD , 10/2022, repeat in 3 years  Prostate Cancer Screening: Denies urinating frequency. Some urinary hesitancy.   Immunizations       Tetanus - UTD         Exercise: Gets regular exercise 2 days weights, 2 days cardio. Alcohol use:  none currently Smoking/tobacco use: Nonsmoker.    Health Maintenance  Topic Date Due   COVID-19 Vaccine (6 - 2024-25 season) 09/18/2023*   Flu Shot  09/10/2023   Colon Cancer Screening  10/20/2025   DTaP/Tdap/Td vaccine (3 - Td or Tdap) 07/17/2030   Hepatitis C Screening  Completed   HIV Screening  Completed   Meningitis B Vaccine  Aged Out  Hepatitis B Vaccine  Discontinued   HPV Vaccine  Discontinued  *Topic was postponed. The date shown is not the original due date.     ALLERGIES: Erythromycin  and Penicillins  Current Outpatient Medications on File Prior to Visit  Medication Sig Dispense Refill   clindamycin  (CLEOCIN  T) 1 % lotion Apply 1 Application topically to face once daily. 60 mL 2   metoprolol  succinate (TOPROL  XL) 25 MG 24 hr tablet Take 1 tablet (25 mg total) by mouth daily. 90 tablet 3   Multiple Vitamins-Minerals (MENS MULTIVITAMIN) TABS Take by mouth daily.     propranolol  (INDERAL ) 20 MG tablet Take 1 tablet (20 mg total) by mouth as needed (for tachycardia spell). 90 tablet 0    Vitamin D , Cholecalciferol , 25 MCG (1000 UT) TABS Take 1,000 Units by mouth daily.     No current facility-administered medications on file prior to visit.    Review of Systems  Constitutional:  Negative for chills and fever.  HENT:  Negative for congestion.   Eyes:  Positive for discharge and itching. Negative for photophobia, redness and visual disturbance.  Respiratory:  Negative for cough.   Cardiovascular:  Negative for chest pain, palpitations and leg swelling.  Gastrointestinal:  Negative for diarrhea, nausea and vomiting.  Genitourinary:  Positive for difficulty urinating (episodic hesistancy). Negative for urgency.  Musculoskeletal:  Negative for myalgias.  Skin:  Negative for rash.  Neurological:  Negative for headaches.  Hematological:  Negative for adenopathy.  Psychiatric/Behavioral:  Negative for confusion.       Objective:    BP 124/66   Pulse 63   Temp 98.2 F (36.8 C)   Ht 6' 4 (1.93 m)   Wt 261 lb 12.8 oz (118.8 kg)   SpO2 96%   BMI 31.87 kg/m   BP Readings from Last 3 Encounters:  09/02/23 124/66  10/21/22 (!) 139/96  08/21/22 126/66   Wt Readings from Last 3 Encounters:  09/02/23 261 lb 12.8 oz (118.8 kg)  10/21/22 (!) 302 lb 3.2 oz (137.1 kg)  08/21/22 (!) 311 lb 3.2 oz (141.2 kg)    Physical Exam Vitals reviewed.  Constitutional:      Appearance: He is well-developed.  HENT:     Head: Normocephalic and atraumatic.     Right Ear: Hearing, tympanic membrane, ear canal and external ear normal. No decreased hearing noted. No drainage, swelling or tenderness. No middle ear effusion. Tympanic membrane is not injected, erythematous or bulging.     Left Ear: Hearing, tympanic membrane, ear canal and external ear normal. No decreased hearing noted. No drainage, swelling or tenderness.  No middle ear effusion. Tympanic membrane is not injected, erythematous or bulging.     Nose: Nose normal.     Right Sinus: No maxillary sinus tenderness or frontal  sinus tenderness.     Left Sinus: No maxillary sinus tenderness or frontal sinus tenderness.     Mouth/Throat:     Pharynx: Uvula midline. No oropharyngeal exudate or posterior oropharyngeal erythema.     Tonsils: No tonsillar abscesses.  Eyes:     General: Lids are normal. Lids are everted, no foreign bodies appreciated.     Conjunctiva/sclera: Conjunctivae normal.     Pupils: Pupils are equal, round, and reactive to light.     Comments: No external eye lesions. Surrounding skin intact.   Left and right eye:   Some injection of the conjunctiva. No white spots, opacity, or foreign body appreciated. No collection of blood or pus in  the anterior chamber. No ciliary flush surrounding iris.   No photophobia or eye pain appreciated during exam.     Neck:     Thyroid : No thyroid  mass or thyromegaly.  Cardiovascular:     Rate and Rhythm: Regular rhythm.     Heart sounds: Normal heart sounds.  Pulmonary:     Effort: Pulmonary effort is normal. No respiratory distress.     Breath sounds: Normal breath sounds. No wheezing, rhonchi or rales.  Lymphadenopathy:     Head:     Right side of head: No submental, submandibular, tonsillar, preauricular, posterior auricular or occipital adenopathy.     Left side of head: No submental, submandibular, tonsillar, preauricular, posterior auricular or occipital adenopathy.     Cervical: No cervical adenopathy.     Right cervical: No superficial, deep or posterior cervical adenopathy.    Left cervical: No superficial, deep or posterior cervical adenopathy.  Skin:    General: Skin is warm and dry.  Neurological:     Mental Status: He is alert.  Psychiatric:        Speech: Speech normal.        Behavior: Behavior normal.

## 2023-09-02 NOTE — Assessment & Plan Note (Signed)
 Presentation c/w  allergic conjunctivitis, more bothersome in the left eye. Advised otc pataday. He will let me know if symptom doesn't resolve.

## 2023-09-03 ENCOUNTER — Other Ambulatory Visit

## 2023-09-03 ENCOUNTER — Ambulatory Visit: Payer: Self-pay | Admitting: Family

## 2023-09-03 DIAGNOSIS — R319 Hematuria, unspecified: Secondary | ICD-10-CM

## 2023-09-03 DIAGNOSIS — Z125 Encounter for screening for malignant neoplasm of prostate: Secondary | ICD-10-CM

## 2023-09-03 DIAGNOSIS — Z0000 Encounter for general adult medical examination without abnormal findings: Secondary | ICD-10-CM

## 2023-09-03 LAB — URINALYSIS, ROUTINE W REFLEX MICROSCOPIC
Leukocytes,Ua: NEGATIVE
Nitrite: NEGATIVE
Specific Gravity, Urine: 1.02 (ref 1.000–1.030)
Total Protein, Urine: NEGATIVE
Urine Glucose: NEGATIVE
Urobilinogen, UA: 0.2 (ref 0.0–1.0)
pH: 6 (ref 5.0–8.0)

## 2023-09-21 ENCOUNTER — Other Ambulatory Visit (INDEPENDENT_AMBULATORY_CARE_PROVIDER_SITE_OTHER)

## 2023-09-21 ENCOUNTER — Other Ambulatory Visit

## 2023-09-21 DIAGNOSIS — R319 Hematuria, unspecified: Secondary | ICD-10-CM | POA: Diagnosis not present

## 2023-09-22 LAB — URINE CULTURE
MICRO NUMBER:: 16819878
Result:: NO GROWTH
SPECIMEN QUALITY:: ADEQUATE

## 2023-09-22 LAB — URINALYSIS, ROUTINE W REFLEX MICROSCOPIC
Bilirubin Urine: NEGATIVE
Hgb urine dipstick: NEGATIVE
Ketones, ur: NEGATIVE
Leukocytes,Ua: NEGATIVE
Nitrite: NEGATIVE
RBC / HPF: NONE SEEN (ref 0–?)
Specific Gravity, Urine: <=1.005 — AB (ref 1.000–1.030)
Total Protein, Urine: NEGATIVE
Urine Glucose: NEGATIVE
Urobilinogen, UA: 0.2 (ref 0.0–1.0)
WBC, UA: NONE SEEN (ref 0–?)
pH: 7 (ref 5.0–8.0)

## 2023-09-23 ENCOUNTER — Ambulatory Visit: Payer: Self-pay | Admitting: Family

## 2023-12-02 ENCOUNTER — Encounter: Admitting: Family

## 2024-01-04 DIAGNOSIS — H524 Presbyopia: Secondary | ICD-10-CM | POA: Diagnosis not present

## 2024-01-12 ENCOUNTER — Other Ambulatory Visit: Payer: Self-pay

## 2024-01-12 DIAGNOSIS — D225 Melanocytic nevi of trunk: Secondary | ICD-10-CM | POA: Diagnosis not present

## 2024-01-12 DIAGNOSIS — D2272 Melanocytic nevi of left lower limb, including hip: Secondary | ICD-10-CM | POA: Diagnosis not present

## 2024-01-12 DIAGNOSIS — D2271 Melanocytic nevi of right lower limb, including hip: Secondary | ICD-10-CM | POA: Diagnosis not present

## 2024-01-12 DIAGNOSIS — L821 Other seborrheic keratosis: Secondary | ICD-10-CM | POA: Diagnosis not present

## 2024-01-12 DIAGNOSIS — D2261 Melanocytic nevi of right upper limb, including shoulder: Secondary | ICD-10-CM | POA: Diagnosis not present

## 2024-01-12 DIAGNOSIS — D2262 Melanocytic nevi of left upper limb, including shoulder: Secondary | ICD-10-CM | POA: Diagnosis not present

## 2024-01-12 DIAGNOSIS — L719 Rosacea, unspecified: Secondary | ICD-10-CM | POA: Diagnosis not present

## 2024-01-12 MED ORDER — CLINDAMYCIN PHOSPHATE 1 % EX LOTN
1.0000 | TOPICAL_LOTION | Freq: Every day | CUTANEOUS | 2 refills | Status: AC
  Filled 2024-01-12: qty 60, 30d supply, fill #0

## 2024-01-13 ENCOUNTER — Other Ambulatory Visit: Payer: Self-pay

## 2024-02-08 ENCOUNTER — Other Ambulatory Visit: Payer: Self-pay
